# Patient Record
Sex: Female | Born: 1965 | Race: White | Hispanic: No | Marital: Married | Smoking: Never smoker
Health system: Southern US, Community
[De-identification: ages and names within clinical notes are randomized; demographics above are authoritative.]

## PROBLEM LIST (undated history)

## (undated) DIAGNOSIS — E282 Polycystic ovarian syndrome: Secondary | ICD-10-CM

## (undated) DIAGNOSIS — E119 Type 2 diabetes mellitus without complications: Secondary | ICD-10-CM

## (undated) DIAGNOSIS — I10 Essential (primary) hypertension: Secondary | ICD-10-CM

## (undated) DIAGNOSIS — K219 Gastro-esophageal reflux disease without esophagitis: Secondary | ICD-10-CM

## (undated) HISTORY — DX: Gastro-esophageal reflux disease without esophagitis: K21.9

## (undated) HISTORY — DX: Polycystic ovarian syndrome: E28.2

## (undated) HISTORY — DX: Type 2 diabetes mellitus without complications: E11.9

## (undated) HISTORY — DX: Essential (primary) hypertension: I10

---

## 1994-11-10 HISTORY — PX: LEEP: SHX91

## 2005-01-14 ENCOUNTER — Ambulatory Visit: Payer: Self-pay | Admitting: Unknown Physician Specialty

## 2008-07-11 ENCOUNTER — Ambulatory Visit: Payer: Self-pay

## 2009-07-03 ENCOUNTER — Ambulatory Visit: Payer: Self-pay

## 2009-10-16 ENCOUNTER — Ambulatory Visit: Payer: Self-pay

## 2010-08-06 ENCOUNTER — Ambulatory Visit: Payer: Self-pay

## 2011-11-19 ENCOUNTER — Ambulatory Visit: Payer: Self-pay

## 2013-09-14 ENCOUNTER — Ambulatory Visit: Payer: Self-pay

## 2014-09-20 ENCOUNTER — Ambulatory Visit: Payer: Self-pay

## 2015-06-13 DIAGNOSIS — N939 Abnormal uterine and vaginal bleeding, unspecified: Secondary | ICD-10-CM

## 2015-06-13 HISTORY — DX: Abnormal uterine and vaginal bleeding, unspecified: N93.9

## 2015-06-17 DIAGNOSIS — R7303 Prediabetes: Secondary | ICD-10-CM | POA: Insufficient documentation

## 2015-06-17 DIAGNOSIS — E119 Type 2 diabetes mellitus without complications: Secondary | ICD-10-CM | POA: Insufficient documentation

## 2015-06-17 DIAGNOSIS — E118 Type 2 diabetes mellitus with unspecified complications: Secondary | ICD-10-CM | POA: Insufficient documentation

## 2016-06-25 ENCOUNTER — Ambulatory Visit: Payer: Self-pay | Attending: Internal Medicine | Admitting: *Deleted

## 2016-06-25 ENCOUNTER — Encounter: Payer: Self-pay | Admitting: *Deleted

## 2016-06-25 ENCOUNTER — Encounter (INDEPENDENT_AMBULATORY_CARE_PROVIDER_SITE_OTHER): Payer: Self-pay

## 2016-06-25 VITALS — BP 144/91 | HR 88 | Temp 98.1°F | Ht 61.81 in | Wt 149.4 lb

## 2016-06-25 DIAGNOSIS — N63 Unspecified lump in unspecified breast: Secondary | ICD-10-CM

## 2016-06-25 NOTE — Patient Instructions (Signed)
Gave patient hand-out, Women Staying Healthy, Active and Well from [facility], with education on breast health, pap smears, heart and colon health. 

## 2016-06-25 NOTE — Progress Notes (Signed)
Subjective:     Patient ID: Sylvie FarrierRhonda Padia, female   DOB: 07/18/1966, 50 y.o.   MRN: [id number]  HPI   Review of Systems     Objective:   Physical Exam  Pulmonary/Chest: Right breast exhibits mass. Right breast exhibits no inverted nipple, no nipple discharge, no skin change and no tenderness. Left breast exhibits no inverted nipple, no mass, no nipple discharge, no skin change and no tenderness. Breasts are symmetrical.    Abdominal: There is no splenomegaly or hepatomegaly.       Assessment:     50 year [facility] White [facility]  Clinical breast exam reveals an approximate 1 cm tender nodule at 8:00 right breast approximately 1 cm from the areola.  Taught self breast awareness.  Patient just started birth control with an IUD in February.  States she has PCOS.  [facility]  Patient has been screened for eligibility.  She does not have any insurance, Medicare or Medicaid.  She also meets financial eligibility.  Hand-out given on the Affordable Care Act.    Plan:     Will get bilateral diagnostic mammogram and ultrasound.  Will follow-up per [facility] protocol.

## 2016-06-26 ENCOUNTER — Ambulatory Visit
Admission: RE | Admit: 2016-06-26 | Discharge: 2016-06-26 | Disposition: A | Discharge status: Home / Self Care | Payer: Self-pay | Source: Ambulatory Visit | Attending: Oncology | Admitting: Oncology

## 2016-06-26 ENCOUNTER — Encounter: Payer: Self-pay | Admitting: *Deleted

## 2016-06-26 ENCOUNTER — Telehealth: Payer: Self-pay | Admitting: *Deleted

## 2016-06-26 DIAGNOSIS — N63 Unspecified lump in unspecified breast: Secondary | ICD-10-CM

## 2016-06-26 NOTE — Telephone Encounter (Signed)
Called patient to review her benign mammogram results.  Although the ultrasound did reveal some tiny cysts I feel it would be prudent to send her on for surgical consult since there was a palpable finding.  She is agreeable.  Appointment scheduled to see Dr. Lemar LivingsByrnett on 07/17/16 @ 9:30.  She is to take a photo ID and all her meds.  Will follow-up per protocol and Dr. Lemar LivingsByrnett.

## 2016-07-10 ENCOUNTER — Encounter: Payer: Self-pay | Admitting: *Deleted

## 2016-07-17 ENCOUNTER — Other Ambulatory Visit: Payer: Self-pay

## 2016-07-17 ENCOUNTER — Encounter: Payer: Self-pay | Admitting: General Surgery

## 2016-07-17 ENCOUNTER — Ambulatory Visit (INDEPENDENT_AMBULATORY_CARE_PROVIDER_SITE_OTHER): Payer: PRIVATE HEALTH INSURANCE | Admitting: General Surgery

## 2016-07-17 DIAGNOSIS — N63 Unspecified lump in breast: Secondary | ICD-10-CM

## 2016-07-17 DIAGNOSIS — N6310 Unspecified lump in the right breast, unspecified quadrant: Secondary | ICD-10-CM

## 2016-07-17 NOTE — Progress Notes (Signed)
Patient ID: Meagan Gordon, female   DOB: 06-01-1966, 50 y.o.   MRN: [id number]  Chief Complaint  Patient presents with  . Other    right breast mass    HPI Meagan Gordon is a 50 y.o. female who presents for a breast evaluation. The most recent mammogram and Bilateral breast ultrasound was done on 06/26/16 . She states she did not feel anything different in the breast prior to her presentation for exam with the nurse navigator. She was able to appreciate the thickening described by the nurse but this has waxed and waned. Denies any breast injury or trauma. She does admit to right lateral breast "soreness" that comes and goes for about one month. Patient does perform regular self breast checks and gets regular mammograms done.  She did miss 2016 mammograms.  HPI  Past Medical History:  Diagnosis Date  . Hypertension   . PCOS (polycystic ovarian syndrome)     Past Surgical History:  Procedure Laterality Date  . CESAREAN SECTION  2000  . LEEP  1996    Family History  Problem Relation Age of Onset  . Stroke Mother   . Uterine cancer Mother 49  . Dementia Father   . Breast cancer Neg Hx     Social History Social History  Substance Use Topics  . Smoking status: Never Smoker  . Smokeless tobacco: Never Used  . Alcohol use Yes    No Known Allergies  Current Outpatient Prescriptions  Medication Sig Dispense Refill  . levonorgestrel (MIRENA) 20 MCG/24HR IUD by Intrauterine route.    . metFORMIN (GLUCOPHAGE) 500 MG tablet Take 500 mg by mouth.    . ranitidine (ZANTAC) 15 MG/ML syrup Take by mouth.    . spironolactone (ALDACTONE) 50 MG tablet Take 50 mg by mouth.     No current facility-administered medications for this visit.     Review of Systems Review of Systems  Constitutional: Negative.   Respiratory: Negative.   Cardiovascular: Negative.     Blood pressure (!) 144/82, pulse 90, resp. rate 14, height 5\' 2"  (1.575 m), weight 147 lb (66.7 kg).  Physical  Exam Physical Exam  Constitutional: She is oriented to person, place, and time. She appears well-developed and well-nourished.  HENT:  Mouth/Throat: Oropharynx is clear and moist.  Eyes: Conjunctivae are normal. No scleral icterus.  Neck: Neck supple.  Cardiovascular: Normal rate, regular rhythm and normal heart sounds.   Pulmonary/Chest: Effort normal and breath sounds normal. Right breast exhibits mass. Right breast exhibits no inverted nipple, no nipple discharge, no skin change and no tenderness. Left breast exhibits no inverted nipple, no mass, no nipple discharge, no skin change and no tenderness.    5 CFN at 8 o'clock right breast nodule.  Abdominal: Soft. There is no tenderness.  Lymphadenopathy:    She has no cervical adenopathy.  Neurological: She is alert and oriented to person, place, and time.  Skin: Skin is warm and dry.  Psychiatric: Her behavior is normal.    Data Reviewed Bilateral mammogram and bilateral breast ultrasound of 06/26/2016 reviewed. Right breast ultrasound described a 3 mm cyst 1 cm from the nipple which did not correlate with the physical exam reported by the nurse navigator or myself.  Focused ultrasound examination of the lower outer quadrant of the right breast was completed. This shows the breast parenchyma in the area of palpable thickening is a small ridge measuring 1.4 x 1.7 cm in diameter. This approaches the overlying skin at 0.65 cm were  the adjacent tissue is significantly further weighted 0.85 cm. This accounts for the palpable texture.  BI-RADS-2.   Assessment    Benign breast exam.    Plan    The patient should continue routine self examination and annual screening through the cancer Center.     This information has been scribed by Dorathy DaftMarsha Hatch RN, BSN,BC.  Earline MayotteByrnett, Manuelito Poage W 07/19/2016, 2:01 PM

## 2016-07-17 NOTE — Patient Instructions (Signed)
The patient is aware to call back for any questions or concerns.  

## 2016-07-19 DIAGNOSIS — N6310 Unspecified lump in the right breast, unspecified quadrant: Secondary | ICD-10-CM | POA: Insufficient documentation

## 2016-07-22 ENCOUNTER — Encounter: Payer: Self-pay | Admitting: *Deleted

## 2016-07-22 NOTE — Progress Notes (Signed)
Patient seen by Dr. Lemar LivingsByrnett.  Benign findings.  To follow-up in one year per his recommendation.  [facility] [place].

## 2017-07-06 ENCOUNTER — Encounter: Payer: Self-pay | Admitting: *Deleted

## 2017-07-06 ENCOUNTER — Encounter (INDEPENDENT_AMBULATORY_CARE_PROVIDER_SITE_OTHER): Payer: Self-pay

## 2017-07-06 ENCOUNTER — Ambulatory Visit
Admission: RE | Admit: 2017-07-06 | Discharge: 2017-07-06 | Disposition: A | Discharge status: Home / Self Care | Payer: Self-pay | Source: Ambulatory Visit | Attending: Oncology | Admitting: Oncology

## 2017-07-06 ENCOUNTER — Ambulatory Visit: Payer: Self-pay | Attending: Oncology | Admitting: *Deleted

## 2017-07-06 VITALS — BP 121/79 | HR 86 | Temp 98.5°F | Ht 62.0 in | Wt 142.0 lb

## 2017-07-06 DIAGNOSIS — Z0000 Encounter for general adult medical examination without abnormal findings: Secondary | ICD-10-CM

## 2017-07-06 DIAGNOSIS — N63 Unspecified lump in unspecified breast: Secondary | ICD-10-CM

## 2017-07-06 NOTE — Progress Notes (Signed)
Subjective:     Patient ID: Meagan Gordon, female   DOB: [birth year], 51 y.o.   MRN: [id number]  HPI   Review of Systems     Objective:   Physical Exam  Pulmonary/Chest:         Assessment:     51 year old White female returns to [facility] for annual screening.  On clinical breast exam I can again palpate an approximate 1 cm nodule at 7:00 right breast.  This is the same area palpated last year and felt to be of benign findings per Dr. Lemar Livings.  Taught self breast awareness.  Patient has been screened for eligibility.  She does not have any insurance, Medicare or Medicaid.  She also meets financial eligibility.  Hand-out given on the Affordable Care Act.    Plan:     Screening mammogram ordered.  Will follow-up per [facility] protocol.

## 2017-07-06 NOTE — Patient Instructions (Signed)
Gave patient hand-out, Women Staying Healthy, Active and Well from [facility], with education on breast health, pap smears, heart and colon health. 

## 2017-07-21 ENCOUNTER — Ambulatory Visit
Admission: RE | Admit: 2017-07-21 | Discharge: 2017-07-21 | Disposition: A | Discharge status: Home / Self Care | Payer: Self-pay | Source: Ambulatory Visit | Attending: Oncology | Admitting: Oncology

## 2017-07-21 DIAGNOSIS — N63 Unspecified lump in unspecified breast: Secondary | ICD-10-CM

## 2017-07-27 ENCOUNTER — Other Ambulatory Visit: Payer: Self-pay | Admitting: *Deleted

## 2017-07-27 DIAGNOSIS — N63 Unspecified lump in unspecified breast: Secondary | ICD-10-CM

## 2017-08-12 ENCOUNTER — Encounter: Payer: Self-pay | Admitting: Family Medicine

## 2017-08-25 ENCOUNTER — Encounter: Payer: Self-pay | Admitting: *Deleted

## 2017-08-25 NOTE — Progress Notes (Signed)
Letter mailed to inform patient of her birads 3 mammogram and appointment on January 19, 2018 @ 9:00 for her six month follow-up.

## 2018-01-19 ENCOUNTER — Ambulatory Visit
Admission: RE | Admit: 2018-01-19 | Discharge: 2018-01-19 | Disposition: A | Discharge status: Home / Self Care | Payer: Self-pay | Source: Ambulatory Visit | Attending: Oncology | Admitting: Oncology

## 2018-01-19 DIAGNOSIS — N6322 Unspecified lump in the left breast, upper inner quadrant: Secondary | ICD-10-CM | POA: Insufficient documentation

## 2018-01-19 DIAGNOSIS — N63 Unspecified lump in unspecified breast: Secondary | ICD-10-CM

## 2018-01-19 DIAGNOSIS — N6324 Unspecified lump in the left breast, lower inner quadrant: Secondary | ICD-10-CM | POA: Insufficient documentation

## 2018-01-20 ENCOUNTER — Ambulatory Visit: Payer: Self-pay | Attending: Oncology

## 2018-01-21 ENCOUNTER — Encounter: Payer: Self-pay | Admitting: *Deleted

## 2018-01-21 ENCOUNTER — Other Ambulatory Visit: Payer: Self-pay | Admitting: *Deleted

## 2018-01-21 DIAGNOSIS — N63 Unspecified lump in unspecified breast: Secondary | ICD-10-CM

## 2018-01-21 NOTE — Progress Notes (Signed)
Letter mailed to inform patient of her 6 month f/u and annual mammogram on 07/07/18 at 8:30 for [facility] and 10:00 for mammo.  [facility] to [place].

## 2018-01-21 NOTE — Progress Notes (Signed)
Patient with birads 3 mammogram.  Next mammo due in August.  Appointment letter mailed to inform patient of her [facility] appointment on 07/07/18 @ 8:30 and her mammogram at 10:00.

## 2018-05-27 ENCOUNTER — Encounter: Payer: Self-pay | Admitting: Internal Medicine

## 2018-05-27 ENCOUNTER — Other Ambulatory Visit: Payer: Self-pay | Admitting: Internal Medicine

## 2018-05-28 ENCOUNTER — Ambulatory Visit: Payer: BLUE CROSS/BLUE SHIELD | Admitting: Internal Medicine

## 2018-05-28 ENCOUNTER — Encounter: Payer: Self-pay | Admitting: Internal Medicine

## 2018-05-28 VITALS — BP 138/85 | HR 83 | Ht 62.0 in | Wt 146.0 lb

## 2018-05-28 DIAGNOSIS — R7302 Impaired glucose tolerance (oral): Secondary | ICD-10-CM | POA: Diagnosis not present

## 2018-05-28 DIAGNOSIS — K219 Gastro-esophageal reflux disease without esophagitis: Secondary | ICD-10-CM | POA: Diagnosis not present

## 2018-05-28 DIAGNOSIS — E282 Polycystic ovarian syndrome: Secondary | ICD-10-CM | POA: Diagnosis not present

## 2018-05-28 DIAGNOSIS — R1011 Right upper quadrant pain: Secondary | ICD-10-CM | POA: Insufficient documentation

## 2018-05-28 DIAGNOSIS — Z23 Encounter for immunization: Secondary | ICD-10-CM | POA: Diagnosis not present

## 2018-05-28 MED ORDER — OMEPRAZOLE 20 MG PO CPDR: 20.0000 mg | DELAYED_RELEASE_CAPSULE | Freq: Every day | ORAL | 5 refills | Status: DC

## 2018-05-28 NOTE — Patient Instructions (Signed)
Tdap Vaccine (Tetanus, Diphtheria and Pertussis): What You Need to Know 1. Why get vaccinated? Tetanus, diphtheria and pertussis are very serious diseases. Tdap vaccine can protect us from these diseases. And, Tdap vaccine given to pregnant women can protect newborn babies against pertussis. TETANUS (Lockjaw) is rare in the [place] today. It causes painful muscle tightening and stiffness, usually all over the body.  It can lead to tightening of muscles in the head and neck so you can't open your mouth, swallow, or sometimes even breathe. Tetanus kills about 1 out of 10 people who are infected even after receiving the best medical care.  DIPHTHERIA is also rare in the [place] today. It can cause a thick coating to form in the back of the throat.  It can lead to breathing problems, heart failure, paralysis, and death.  PERTUSSIS (Whooping Cough) causes severe coughing spells, which can cause difficulty breathing, vomiting and disturbed sleep.  It can also lead to weight loss, incontinence, and rib fractures. Up to 2 in 100 adolescents and 5 in 100 adults with pertussis are hospitalized or have complications, which could include pneumonia or death.  These diseases are caused by bacteria. Diphtheria and pertussis are spread from person to person through secretions from coughing or sneezing. Tetanus enters the body through cuts, scratches, or wounds. Before vaccines, as many as 200,000 cases of diphtheria, 200,000 cases of pertussis, and hundreds of cases of tetanus, were reported in the [place] each year. Since vaccination began, reports of cases for tetanus and diphtheria have dropped by about 99% and for pertussis by about 80%. 2. Tdap vaccine Tdap vaccine can protect adolescents and adults from tetanus, diphtheria, and pertussis. One dose of Tdap is routinely given at age 11 or 12. People who did not get Tdap at that age should get it as soon as possible. Tdap is especially  important for healthcare professionals and anyone having close contact with a baby younger than 12 months. Pregnant women should get a dose of Tdap during every pregnancy, to protect the newborn from pertussis. Infants are most at risk for severe, life-threatening complications from pertussis. Another vaccine, called Td, protects against tetanus and diphtheria, but not pertussis. A Td booster should be given every 10 years. Tdap may be given as one of these boosters if you have never gotten Tdap before. Tdap may also be given after a severe cut or burn to prevent tetanus infection. Your doctor or the person giving you the vaccine can give you more information. Tdap may safely be given at the same time as other vaccines. 3. Some people should not get this vaccine  A person who has ever had a life-threatening allergic reaction after a previous dose of any diphtheria, tetanus or pertussis containing vaccine, OR has a severe allergy to any part of this vaccine, should not get Tdap vaccine. Tell the person giving the vaccine about any severe allergies.  Anyone who had coma or long repeated seizures within 7 days after a childhood dose of DTP or DTaP, or a previous dose of Tdap, should not get Tdap, unless a cause other than the vaccine was found. They can still get Td.  Talk to your doctor if you: ? have seizures or another nervous system problem, ? had severe pain or swelling after any vaccine containing diphtheria, tetanus or pertussis, ? ever had a condition called Guillain-Barr Syndrome (GBS), ? aren't feeling well on the day the shot is scheduled. 4. Risks With any medicine, including   vaccines, there is a chance of side effects. These are usually mild and go away on their own. Serious reactions are also possible but are rare. Most people who get Tdap vaccine do not have any problems with it. Mild problems following Tdap: (Did not interfere with activities)  Pain where the shot was given (about  3 in 4 adolescents or 2 in 3 adults)  Redness or swelling where the shot was given (about 1 person in 5)  Mild fever of at least 100.4F (up to about 1 in 25 adolescents or 1 in 100 adults)  Headache (about 3 or 4 people in 10)  Tiredness (about 1 person in 3 or 4)  Nausea, vomiting, diarrhea, stomach ache (up to 1 in 4 adolescents or 1 in 10 adults)  Chills, sore joints (about 1 person in 10)  Body aches (about 1 person in 3 or 4)  Rash, swollen glands (uncommon)  Moderate problems following Tdap: (Interfered with activities, but did not require medical attention)  Pain where the shot was given (up to 1 in 5 or 6)  Redness or swelling where the shot was given (up to about 1 in 16 adolescents or 1 in 12 adults)  Fever over 102F (about 1 in 100 adolescents or 1 in 250 adults)  Headache (about 1 in 7 adolescents or 1 in 10 adults)  Nausea, vomiting, diarrhea, stomach ache (up to 1 or 3 people in 100)  Swelling of the entire arm where the shot was given (up to about 1 in 500).  Severe problems following Tdap: (Unable to perform usual activities; required medical attention)  Swelling, severe pain, bleeding and redness in the arm where the shot was given (rare).  Problems that could happen after any vaccine:  People sometimes faint after a medical procedure, including vaccination. Sitting or lying down for about 15 minutes can help prevent fainting, and injuries caused by a fall. Tell your doctor if you feel dizzy, or have vision changes or ringing in the ears.  Some people get severe pain in the shoulder and have difficulty moving the arm where a shot was given. This happens very rarely.  Any medication can cause a severe allergic reaction. Such reactions from a vaccine are very rare, estimated at fewer than 1 in a million doses, and would happen within a few minutes to a few hours after the vaccination. As with any medicine, there is a very remote chance of a vaccine  causing a serious injury or death. The safety of vaccines is always being monitored. For more information, visit: www.cdc.gov/vaccinesafety/ 5. What if there is a serious problem? What should I look for? Look for anything that concerns you, such as signs of a severe allergic reaction, very high fever, or unusual behavior. Signs of a severe allergic reaction can include hives, swelling of the face and throat, difficulty breathing, a fast heartbeat, dizziness, and weakness. These would usually start a few minutes to a few hours after the vaccination. What should I do?  If you think it is a severe allergic reaction or other emergency that can't wait, call 9-1-1 or get the person to the nearest hospital. Otherwise, call your doctor.  Afterward, the reaction should be reported to the Vaccine Adverse Event Reporting System (VAERS). Your doctor might file this report, or you can do it yourself through the VAERS web site at www.vaers.hhs.gov, or by calling 1-800-822-7967. ? VAERS does not give medical advice. 6. The National Vaccine Injury Compensation Program The National   Vaccine Injury Compensation Program (VICP) is a federal program that was created to compensate people who may have been injured by certain vaccines. Persons who believe they may have been injured by a vaccine can learn about the program and about filing a claim by calling 1-800-338-2382 or visiting the VICP website at www.hrsa.gov/vaccinecompensation. There is a time limit to file a claim for compensation. 7. How can I learn more?  Ask your doctor. He or she can give you the vaccine package insert or suggest other sources of information.  Call your local or state health department.  Contact the Centers for Disease Control and Prevention (CDC): ? Call 1-800-232-4636 (1-800-CDC-INFO) or ? Visit CDC's website at www.cdc.gov/vaccines CDC Tdap Vaccine VIS (01/03/14) This information is not intended to replace advice given to you by your  health care provider. Make sure you discuss any questions you have with your health care provider. Document Released: 04/27/2012 Document Revised: 07/17/2016 Document Reviewed: 07/17/2016 Elsevier Interactive Patient Education  2017 Elsevier Inc.  

## 2018-05-28 NOTE — Progress Notes (Signed)
Date:  05/28/2018   Name:  Meagan FarrierRhonda Gordon   DOB:  03/09/1966   MRN:  [id number]   Chief Complaint: Establish Care; Diabetes (Has borderline DM. ); and Gastroesophageal Reflux (Refill on omeprazole. )  Diabetes  She presents for her follow-up diabetic visit. Diabetes type: prediabetes. Her disease course has been stable. Pertinent negatives for hypoglycemia include no headaches or tremors. Pertinent negatives for diabetes include no chest pain, no fatigue, no polydipsia and no polyuria. Current diabetic treatment includes oral agent (monotherapy). An ACE inhibitor/angiotensin II receptor blocker is not being taken.  Gastroesophageal Reflux  She complains of abdominal pain and heartburn. She reports no chest pain, no coughing or no nausea. This is a new problem. The current episode started more than 1 month ago. The problem occurs constantly. The problem has been unchanged. Pertinent negatives include no fatigue. She has tried a PPI for the symptoms. The treatment provided moderate relief.  PCOS - diagnosed a few years ago.  On spironolactone and metformin. She keeps her small grandchildren regularly.  Does not know when the last tetanus shot was given.   Review of Systems  Constitutional: Negative for appetite change, fatigue, fever and unexpected weight change.  HENT: Negative for tinnitus and trouble swallowing.   Eyes: Negative for visual disturbance.  Respiratory: Negative for cough, chest tightness and shortness of breath.   Cardiovascular: Negative for chest pain, palpitations and leg swelling.  Gastrointestinal: Positive for abdominal pain and heartburn. Negative for blood in stool, constipation and nausea.  Endocrine: Negative for polydipsia and polyuria.  Genitourinary: Negative for dysuria and hematuria.  Musculoskeletal: Negative for arthralgias.  Neurological: Negative for tremors, numbness and headaches.  Psychiatric/Behavioral: Negative for dysphoric mood.    Patient Active  Problem List   Diagnosis Date Noted  . PCOS (polycystic ovarian syndrome) 05/28/2018  . Gastroesophageal reflux disease without esophagitis 05/28/2018  . Abdominal pain, RUQ 05/28/2018  . Breast mass, right 07/19/2016  . Impaired glucose tolerance 06/17/2015  . Abnormal uterine bleeding 06/13/2015    Prior to Admission medications   Medication Sig Start Date End Date Taking? Authorizing Provider  levonorgestrel (MIRENA) 20 MCG/24HR IUD by Intrauterine route. 12/31/15  Yes [provider]  metFORMIN (GLUCOPHAGE) 500 MG tablet Take 500 mg by mouth daily with breakfast.    Yes [provider]  omeprazole (PRILOSEC) 20 MG capsule Take by mouth. 01/29/18 01/29/19 Yes [provider]  spironolactone (ALDACTONE) 50 MG tablet Take 50 mg by mouth daily.  11/22/15 11/21/16  [provider]    No Known Allergies  Past Surgical History:  Procedure Laterality Date  . CESAREAN SECTION  2000  . LEEP  1996    Social History   Tobacco Use  . Smoking status: Never Smoker  . Smokeless tobacco: Never Used  Substance Use Topics  . Alcohol use: Yes    Comment: ocassional  . Drug use: No     Medication list has been reviewed and updated.  Current Meds  Medication Sig  . levonorgestrel (MIRENA) 20 MCG/24HR IUD by Intrauterine route.  . metFORMIN (GLUCOPHAGE) 500 MG tablet Take 500 mg by mouth daily with breakfast.   . omeprazole (PRILOSEC) 20 MG capsule Take 1 capsule (20 mg total) by mouth daily.  . [DISCONTINUED] omeprazole (PRILOSEC) 20 MG capsule Take by mouth.    PHQ 2/9 Scores 05/28/2018  PHQ - 2 Score 0    Physical Exam  Constitutional: She is oriented to person, place, and time. She appears well-developed.  No distress.  HENT:  Head: Normocephalic and atraumatic.  Eyes: Pupils are equal, round, and reactive to light.  Neck: Normal range of motion. Neck supple.  Cardiovascular: Normal rate and regular rhythm.  Pulmonary/Chest: Effort normal and  breath sounds normal. No respiratory distress. She has no wheezes.  Abdominal: Soft. Bowel sounds are normal. She exhibits no mass. There is no hepatosplenomegaly. There is tenderness in the right upper quadrant. There is no guarding.  Musculoskeletal: Normal range of motion.  Lymphadenopathy:    She has no cervical adenopathy.  Neurological: She is alert and oriented to person, place, and time.  Skin: Skin is warm and dry. No rash noted.  Psychiatric: She has a normal mood and affect. Her behavior is normal. Thought content normal.  Nursing note and vitals reviewed.   BP 138/85   Pulse 83   Ht 5\' 2"  (1.575 m)   Wt 146 lb (66.2 kg)   SpO2 98%   BMI 26.70 kg/m   Assessment and Plan: 1. Impaired glucose tolerance Continue metformin - try to take daily - Hemoglobin A1c  2. Gastroesophageal reflux disease without esophagitis Improved so will continue PPI - omeprazole (PRILOSEC) 20 MG capsule; Take 1 capsule (20 mg total) by mouth daily.  Dispense: 30 capsule; Refill: 5  3. Abdominal pain, RUQ Check H pylori and [place] - US Abdomen Limited RUQ; Future - H. pylori antibody, IgG  4. PCOS (polycystic ovarian syndrome) Continue spironolactone - Comprehensive metabolic panel  5. Need for diphtheria-tetanus-pertussis (Tdap) vaccine - Tdap vaccine greater than or equal to 7yo IM   Meds ordered this encounter  Medications  . omeprazole (PRILOSEC) 20 MG capsule    Sig: Take 1 capsule (20 mg total) by mouth daily.    Dispense:  30 capsule    Refill:  5    Partially dictated using Animal nutritionist. Any errors are unintentional.  Bari Edward, MD [facility]  05/28/2018   There are no diagnoses linked to this encounter.

## 2018-05-31 ENCOUNTER — Other Ambulatory Visit: Payer: Self-pay | Admitting: Internal Medicine

## 2018-05-31 ENCOUNTER — Encounter: Payer: Self-pay | Admitting: Internal Medicine

## 2018-05-31 DIAGNOSIS — A048 Other specified bacterial intestinal infections: Secondary | ICD-10-CM

## 2018-05-31 LAB — COMPREHENSIVE METABOLIC PANEL
A/G RATIO: 1.6 (ref 1.2–2.2)
ALBUMIN: 4.7 g/dL (ref 3.5–5.5)
ALK PHOS: 103 IU/L (ref 39–117)
ALT: 17 IU/L (ref 0–32)
AST: 17 IU/L (ref 0–40)
BILIRUBIN TOTAL: 0.3 mg/dL (ref 0.0–1.2)
BUN / CREAT RATIO: 14 (ref 9–23)
BUN: 12 mg/dL (ref 6–24)
CO2: 25 mmol/L (ref 20–29)
CREATININE: 0.88 mg/dL (ref 0.57–1.00)
Calcium: 9.9 mg/dL (ref 8.7–10.2)
Chloride: 98 mmol/L (ref 96–106)
GFR calc Af Amer: 89 mL/min/{1.73_m2} (ref >59)
GFR calc non Af Amer: 77 mL/min/{1.73_m2} (ref >59)
GLOBULIN, TOTAL: 2.9 g/dL (ref 1.5–4.5)
Glucose: 98 mg/dL (ref 65–99)
POTASSIUM: 4.1 mmol/L (ref 3.5–5.2)
SODIUM: 140 mmol/L (ref 134–144)
Total Protein: 7.6 g/dL (ref 6.0–8.5)

## 2018-05-31 LAB — H. PYLORI ANTIBODY, IGG: H. pylori, IgG AbS: 2.82 Index Value — ABNORMAL HIGH (ref 0.00–0.79)

## 2018-05-31 LAB — HEMOGLOBIN A1C
Est. average glucose Bld gHb Est-mCnc: 128 mg/dL
HEMOGLOBIN A1C: 6.1 % — AB (ref 4.8–5.6)

## 2018-05-31 MED ORDER — CLARITHROMYCIN 500 MG PO TABS: 500.0000 mg | ORAL_TABLET | Freq: Two times a day (BID) | ORAL | 0 refills | Status: AC

## 2018-05-31 MED ORDER — AMOXICILLIN 500 MG PO CAPS: 1000.0000 mg | ORAL_CAPSULE | Freq: Two times a day (BID) | ORAL | 0 refills | Status: AC

## 2018-06-01 ENCOUNTER — Ambulatory Visit: Payer: BLUE CROSS/BLUE SHIELD

## 2018-06-09 ENCOUNTER — Encounter: Payer: Self-pay | Admitting: Internal Medicine

## 2018-06-09 NOTE — Telephone Encounter (Signed)
Please advise patient message. I know we do not recheck h pylori. How long can it take for patient to feel back to normal?

## 2018-07-07 ENCOUNTER — Ambulatory Visit
Admission: RE | Admit: 2018-07-07 | Discharge: 2018-07-07 | Disposition: A | Discharge status: Home / Self Care | Payer: BLUE CROSS/BLUE SHIELD | Source: Ambulatory Visit | Attending: Oncology | Admitting: Oncology

## 2018-07-07 ENCOUNTER — Ambulatory Visit: Payer: BLUE CROSS/BLUE SHIELD | Attending: Oncology

## 2018-07-07 DIAGNOSIS — N63 Unspecified lump in unspecified breast: Secondary | ICD-10-CM

## 2018-07-09 ENCOUNTER — Ambulatory Visit: Payer: BLUE CROSS/BLUE SHIELD | Admitting: Internal Medicine

## 2018-07-09 ENCOUNTER — Encounter: Payer: Self-pay | Admitting: Internal Medicine

## 2018-07-09 VITALS — BP 124/90 | HR 95 | Ht 62.0 in | Wt 147.0 lb

## 2018-07-09 DIAGNOSIS — R1011 Right upper quadrant pain: Secondary | ICD-10-CM | POA: Diagnosis not present

## 2018-07-09 DIAGNOSIS — M779 Enthesopathy, unspecified: Secondary | ICD-10-CM | POA: Insufficient documentation

## 2018-07-09 DIAGNOSIS — M722 Plantar fascial fibromatosis: Secondary | ICD-10-CM | POA: Diagnosis not present

## 2018-07-09 MED ORDER — MELOXICAM 15 MG PO TABS: 15.0000 mg | ORAL_TABLET | Freq: Every day | ORAL | 3 refills | Status: DC

## 2018-07-09 NOTE — Progress Notes (Signed)
Date:  07/09/2018   Name:  Meagan FarrierRhonda Mccully   DOB:  07/05/1966   MRN:  [id number]   Chief Complaint: Abdominal Pain (Pain that feels like have "rock" sitting in URQ. On and off. ); Arm Pain (Right arm- 4 weeks ago. Painful- hurts when lifting, and motion. Tried asper cream, tylenol and arm brace which made it worse. ); and Foot Pain (Left heel of foot hurting. Had plantar faciatis before but this pain is different. )  Abdominal Pain  This is a chronic problem. The problem occurs daily. The problem has been rapidly improving. The pain is located in the RUQ. The pain is mild (much improved after treatment for H PYlor). Associated symptoms include arthralgias and myalgias. Pertinent negatives include no constipation, diarrhea, dysuria, fever, headaches, hematuria, nausea or vomiting.  Arm Pain   There was no injury mechanism. The pain is present in the right forearm. The quality of the pain is described as burning and aching. The pain does not radiate. The pain is mild. Pertinent negatives include no chest pain, muscle weakness, numbness or tingling. The symptoms are aggravated by movement and lifting.  Foot Pain  This is a recurrent problem. Associated symptoms include abdominal pain, arthralgias and myalgias. Pertinent negatives include no chest pain, coughing, fatigue, fever, headaches, nausea, numbness or vomiting.  Foot pain similar to previous plantar fasciits but in a different location on the side of the heel and on the sole of the foot at the heel.   Review of Systems  Constitutional: Negative for appetite change, fatigue, fever and unexpected weight change.  HENT: Negative for tinnitus and trouble swallowing.   Respiratory: Negative for cough, chest tightness and shortness of breath.   Cardiovascular: Negative for chest pain, palpitations and leg swelling.  Gastrointestinal: Positive for abdominal pain. Negative for constipation, diarrhea, nausea and vomiting.  Endocrine: Negative for  polydipsia and polyuria.  Genitourinary: Negative for dysuria and hematuria.  Musculoskeletal: Positive for arthralgias and myalgias.  Neurological: Negative for tingling, tremors, numbness and headaches.  Psychiatric/Behavioral: Negative for dysphoric mood.    Patient Active Problem List   Diagnosis Date Noted  . PCOS (polycystic ovarian syndrome) 05/28/2018  . Gastroesophageal reflux disease without esophagitis 05/28/2018  . Abdominal pain, RUQ 05/28/2018  . Breast mass, right 07/19/2016  . Impaired glucose tolerance 06/17/2015  . Abnormal uterine bleeding 06/13/2015    No Known Allergies  Past Surgical History:  Procedure Laterality Date  . CESAREAN SECTION  2000  . LEEP  1996    Social History   Tobacco Use  . Smoking status: Never Smoker  . Smokeless tobacco: Never Used  Substance Use Topics  . Alcohol use: Yes    Comment: ocassional  . Drug use: No     Medication list has been reviewed and updated.  Current Meds  Medication Sig  . levonorgestrel (MIRENA) 20 MCG/24HR IUD by Intrauterine route.  . metFORMIN (GLUCOPHAGE) 500 MG tablet Take 500 mg by mouth daily with breakfast.   . omeprazole (PRILOSEC) 20 MG capsule Take 1 capsule (20 mg total) by mouth daily.  Marland Kitchen. spironolactone (ALDACTONE) 50 MG tablet Take 50 mg by mouth daily.     PHQ 2/9 Scores 05/28/2018  PHQ - 2 Score 0    Physical Exam  Constitutional: She is oriented to person, place, and time. She appears well-developed. No distress.  HENT:  Head: Normocephalic and atraumatic.  Cardiovascular: Normal rate and regular rhythm.  Pulmonary/Chest: Effort normal. No respiratory distress.  Abdominal: Soft.  Normal appearance, normal aorta and bowel sounds are normal. There is tenderness (mild) in the right upper quadrant.  Musculoskeletal: Normal range of motion.       Arms:      Feet:  Neurological: She is alert and oriented to person, place, and time.  Skin: Skin is warm and dry. No rash noted.    Psychiatric: She has a normal mood and affect. Her behavior is normal. Thought content normal.  Nursing note and vitals reviewed.   BP 124/90 (BP Location: Left Arm, Patient Position: Sitting, Cuff Size: Normal)   Pulse 95   Ht 5\' 2"  (1.575 m)   Wt 147 lb (66.7 kg)   SpO2 99%   BMI 26.89 kg/m   Assessment and Plan: 1. Abdominal pain, RUQ Improved after H Pylori treatment but still intermittent RUQ discomfort - US Abdomen Limited RUQ; Future  2. Tendonitis Ice after use, limit aggravating movements - meloxicam (MOBIC) 15 MG tablet; Take 1 tablet (15 mg total) by mouth daily.  Dispense: 30 tablet; Refill: 3  3. Plantar fasciitis of left foot Ice massage, stretching (pt has had treatment in the past for same) - meloxicam (MOBIC) 15 MG tablet; Take 1 tablet (15 mg total) by mouth daily.  Dispense: 30 tablet; Refill: 3   Meds ordered this encounter  Medications  . meloxicam (MOBIC) 15 MG tablet    Sig: Take 1 tablet (15 mg total) by mouth daily.    Dispense:  30 tablet    Refill:  3    Partially dictated using Animal nutritionist. Any errors are unintentional.  Bari Edward, MD [facility]  07/09/2018   There are no diagnoses linked to this encounter.

## 2018-07-14 ENCOUNTER — Ambulatory Visit: Payer: BLUE CROSS/BLUE SHIELD

## 2018-07-16 ENCOUNTER — Emergency Department
Admission: EM | Admit: 2018-07-16 | Discharge: 2018-07-16 | Disposition: A | Discharge status: Home / Self Care | Payer: BLUE CROSS/BLUE SHIELD | Attending: Emergency Medicine | Admitting: Emergency Medicine

## 2018-07-16 ENCOUNTER — Emergency Department: Payer: BLUE CROSS/BLUE SHIELD

## 2018-07-16 ENCOUNTER — Other Ambulatory Visit: Payer: Self-pay

## 2018-07-16 DIAGNOSIS — M47812 Spondylosis without myelopathy or radiculopathy, cervical region: Secondary | ICD-10-CM | POA: Insufficient documentation

## 2018-07-16 DIAGNOSIS — M542 Cervicalgia: Secondary | ICD-10-CM | POA: Diagnosis not present

## 2018-07-16 DIAGNOSIS — M4302 Spondylolysis, cervical region: Secondary | ICD-10-CM | POA: Diagnosis not present

## 2018-07-16 DIAGNOSIS — Z7984 Long term (current) use of oral hypoglycemic drugs: Secondary | ICD-10-CM | POA: Insufficient documentation

## 2018-07-16 DIAGNOSIS — Z79899 Other long term (current) drug therapy: Secondary | ICD-10-CM | POA: Diagnosis not present

## 2018-07-16 DIAGNOSIS — S299XXA Unspecified injury of thorax, initial encounter: Secondary | ICD-10-CM | POA: Diagnosis not present

## 2018-07-16 DIAGNOSIS — E119 Type 2 diabetes mellitus without complications: Secondary | ICD-10-CM | POA: Diagnosis not present

## 2018-07-16 DIAGNOSIS — S199XXA Unspecified injury of neck, initial encounter: Secondary | ICD-10-CM | POA: Diagnosis not present

## 2018-07-16 DIAGNOSIS — I10 Essential (primary) hypertension: Secondary | ICD-10-CM | POA: Insufficient documentation

## 2018-07-16 MED ORDER — LIDOCAINE 5 % EX PTCH: 1.0000 | MEDICATED_PATCH | Freq: Two times a day (BID) | CUTANEOUS | 0 refills | Status: DC

## 2018-07-16 MED ORDER — CYCLOBENZAPRINE HCL 5 MG PO TABS: ORAL_TABLET | ORAL | 0 refills | Status: DC

## 2018-07-16 MED ORDER — NAPROXEN 500 MG PO TABS: 500.0000 mg | ORAL_TABLET | Freq: Two times a day (BID) | ORAL | 0 refills | Status: DC
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
## 2018-07-19 ENCOUNTER — Ambulatory Visit
Admission: RE | Admit: 2018-07-19 | Discharge: 2018-07-19 | Disposition: A | Discharge status: Home / Self Care | Payer: BLUE CROSS/BLUE SHIELD | Source: Ambulatory Visit | Attending: Internal Medicine | Admitting: Internal Medicine

## 2018-07-19 ENCOUNTER — Encounter: Payer: Self-pay | Admitting: Internal Medicine

## 2018-07-19 DIAGNOSIS — R1011 Right upper quadrant pain: Secondary | ICD-10-CM | POA: Insufficient documentation

## 2018-09-27 ENCOUNTER — Encounter: Payer: Self-pay | Admitting: Internal Medicine

## 2018-09-28 ENCOUNTER — Ambulatory Visit: Payer: BLUE CROSS/BLUE SHIELD | Admitting: Internal Medicine

## 2018-09-28 ENCOUNTER — Encounter: Payer: Self-pay | Admitting: Internal Medicine

## 2018-09-28 VITALS — BP 140/78 | HR 96 | Ht 62.0 in | Wt 146.0 lb

## 2018-09-28 DIAGNOSIS — M62838 Other muscle spasm: Secondary | ICD-10-CM

## 2018-09-28 DIAGNOSIS — K219 Gastro-esophageal reflux disease without esophagitis: Secondary | ICD-10-CM

## 2018-09-28 DIAGNOSIS — Z23 Encounter for immunization: Secondary | ICD-10-CM

## 2018-09-28 DIAGNOSIS — E282 Polycystic ovarian syndrome: Secondary | ICD-10-CM

## 2018-09-28 DIAGNOSIS — R7302 Impaired glucose tolerance (oral): Secondary | ICD-10-CM | POA: Diagnosis not present

## 2018-09-28 MED ORDER — METHOCARBAMOL 500 MG PO TABS: 500.0000 mg | ORAL_TABLET | Freq: Three times a day (TID) | ORAL | 0 refills | Status: DC | PRN

## 2018-09-28 MED ORDER — OMEPRAZOLE 20 MG PO CPDR: 20.0000 mg | DELAYED_RELEASE_CAPSULE | Freq: Two times a day (BID) | ORAL | 5 refills | Status: DC

## 2018-09-28 MED ORDER — OMEPRAZOLE 20 MG PO CPDR: 20.0000 mg | DELAYED_RELEASE_CAPSULE | Freq: Every day | ORAL | 5 refills | Status: DC
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
## 2018-09-29 LAB — BASIC METABOLIC PANEL
BUN/Creatinine Ratio: 13 (ref 9–23)
BUN: 11 mg/dL (ref 6–24)
CALCIUM: 10.1 mg/dL (ref 8.7–10.2)
CO2: 25 mmol/L (ref 20–29)
CREATININE: 0.86 mg/dL (ref 0.57–1.00)
Chloride: 98 mmol/L (ref 96–106)
GFR calc Af Amer: 91 mL/min/{1.73_m2} (ref >59)
GFR, EST NON AFRICAN AMERICAN: 79 mL/min/{1.73_m2} (ref >59)
GLUCOSE: 119 mg/dL — AB (ref 65–99)
Potassium: 4.2 mmol/L (ref 3.5–5.2)
Sodium: 139 mmol/L (ref 134–144)

## 2018-09-29 LAB — HEMOGLOBIN A1C
ESTIMATED AVERAGE GLUCOSE: 128 mg/dL
HEMOGLOBIN A1C: 6.1 % — AB (ref 4.8–5.6)

## 2018-10-15 ENCOUNTER — Other Ambulatory Visit: Payer: Self-pay | Admitting: Internal Medicine

## 2018-10-15 DIAGNOSIS — M62838 Other muscle spasm: Secondary | ICD-10-CM

## 2018-10-22 ENCOUNTER — Other Ambulatory Visit: Payer: Self-pay | Admitting: Internal Medicine

## 2018-10-22 DIAGNOSIS — M779 Enthesopathy, unspecified: Secondary | ICD-10-CM

## 2018-10-22 DIAGNOSIS — M722 Plantar fascial fibromatosis: Secondary | ICD-10-CM

## 2018-11-08 ENCOUNTER — Encounter: Payer: Self-pay | Admitting: Internal Medicine

## 2018-11-08 ENCOUNTER — Ambulatory Visit: Payer: BLUE CROSS/BLUE SHIELD | Admitting: Internal Medicine

## 2018-11-08 VITALS — BP 128/82 | HR 86 | Temp 98.0°F | Ht 62.0 in | Wt 148.0 lb

## 2018-11-08 DIAGNOSIS — J0100 Acute maxillary sinusitis, unspecified: Secondary | ICD-10-CM | POA: Diagnosis not present

## 2018-11-08 DIAGNOSIS — E282 Polycystic ovarian syndrome: Secondary | ICD-10-CM | POA: Diagnosis not present

## 2018-11-08 DIAGNOSIS — M62838 Other muscle spasm: Secondary | ICD-10-CM | POA: Diagnosis not present

## 2018-11-08 MED ORDER — SPIRONOLACTONE 50 MG PO TABS: 50.0000 mg | ORAL_TABLET | Freq: Two times a day (BID) | ORAL | 12 refills | Status: DC

## 2018-11-08 MED ORDER — DOXYCYCLINE HYCLATE 100 MG PO TABS: 100.0000 mg | ORAL_TABLET | Freq: Two times a day (BID) | ORAL | 0 refills | Status: AC

## 2018-11-08 NOTE — Progress Notes (Signed)
Date:  11/08/2018   Name:  Meagan FarrierRhonda Sanabia   DOB:  01/13/1966   MRN:  [id number]   Chief Complaint: Cough (Off and on X 1 month. Husband has bronchitis. Somtimes mucous is coming up mainly in the mornings. Throat sore, ears both hurt inside. No fever but dizziness earlier today. ) and Hypertension (Needs refill on spironalactone. )  Cough  This is a new problem. The current episode started in the past 7 days. The cough is productive of sputum. Associated symptoms include chills, myalgias, nasal congestion, rhinorrhea and a sore throat. Pertinent negatives include no ear pain, fever, shortness of breath or wheezing. She has tried OTC cough suppressant for the symptoms. The treatment provided mild relief.  Neck Pain   This is a new problem. The current episode started more than 1 month ago. The problem has been gradually improving. Pertinent negatives include no fever or trouble swallowing. She has tried NSAIDs (mobic has been very helpful but she is not sure she can take it every day) for the symptoms.   PCOS - on spironolactone daily for PCOS and mild elevation in BP.  She is due for a refill.  She is not sure that she was ever diagnosed with HTN - she has seen cardiology who felt it was mostly white coat HTN and did not recommend additional medication in 2017.  Review of Systems  Constitutional: Positive for chills and fatigue. Negative for diaphoresis, fever and unexpected weight change.  HENT: Positive for congestion, rhinorrhea and sore throat. Negative for ear pain and trouble swallowing.   Respiratory: Positive for cough. Negative for chest tightness, shortness of breath and wheezing.   Musculoskeletal: Positive for myalgias and neck pain.    Patient Active Problem List   Diagnosis Date Noted  . Tendonitis 07/09/2018  . Plantar fasciitis of left foot 07/09/2018  . PCOS (polycystic ovarian syndrome) 05/28/2018  . Gastroesophageal reflux disease without esophagitis 05/28/2018  .  Abdominal pain, RUQ 05/28/2018  . Breast mass, right 07/19/2016  . Prediabetes 06/17/2015  . Abnormal uterine bleeding 06/13/2015    No Known Allergies  Past Surgical History:  Procedure Laterality Date  . CESAREAN SECTION  2000  . LEEP  1996    Social History   Tobacco Use  . Smoking status: Never Smoker  . Smokeless tobacco: Never Used  Substance Use Topics  . Alcohol use: Yes    Comment: ocassional  . Drug use: No     Medication list has been reviewed and updated.  Current Meds  Medication Sig  . levonorgestrel (MIRENA) 20 MCG/24HR IUD by Intrauterine route.  . lidocaine (LIDODERM) 5 % Place 1 patch onto the skin every 12 (twelve) hours. Remove & Discard patch within 12 hours or as directed by MD  . meloxicam (MOBIC) 15 MG tablet TAKE 1 TABLET BY MOUTH EVERY DAY  . metFORMIN (GLUCOPHAGE) 500 MG tablet Take 500 mg by mouth daily with breakfast.   . methocarbamol (ROBAXIN) 500 MG tablet Take 1 tablet (500 mg total) by mouth every 8 (eight) hours as needed for muscle spasms.  Marland Kitchen. omeprazole (PRILOSEC) 20 MG capsule Take 1 capsule (20 mg total) by mouth 2 (two) times daily before a meal.    PHQ 2/9 Scores 05/28/2018  PHQ - 2 Score 0    Physical Exam Constitutional:      Appearance: She is well-developed.  HENT:     Right Ear: Ear canal and external ear normal. Tympanic membrane is not erythematous or  retracted.     Left Ear: Ear canal and external ear normal. Tympanic membrane is not erythematous or retracted.     Nose:     Right Sinus: Maxillary sinus tenderness and frontal sinus tenderness present.     Left Sinus: Maxillary sinus tenderness and frontal sinus tenderness present.     Mouth/Throat:     Mouth: No oral lesions.     Pharynx: Uvula midline. Posterior oropharyngeal erythema present. No oropharyngeal exudate.  Neck:     Musculoskeletal: Normal range of motion and neck supple.  Cardiovascular:     Rate and Rhythm: Normal rate and regular rhythm.      Pulses: Normal pulses.     Heart sounds: Normal heart sounds.  Pulmonary:     Effort: Pulmonary effort is normal.     Breath sounds: Normal breath sounds and air entry. No decreased breath sounds, wheezing or rales.  Lymphadenopathy:     Cervical: No cervical adenopathy.  Neurological:     Mental Status: She is alert and oriented to person, place, and time.     BP 128/82 (BP Location: Right Arm, Patient Position: Sitting, Cuff Size: Normal)   Pulse 86   Temp 98 F (36.7 C) (Oral)   Ht 5\' 2"  (1.575 m)   Wt 148 lb (67.1 kg)   SpO2 100%   BMI 27.07 kg/m   Assessment and Plan: 1. Acute non-recurrent maxillary sinusitis Take Tessalon perles or Delsym for cough - doxycycline (VIBRA-TABS) 100 MG tablet; Take 1 tablet (100 mg total) by mouth 2 (two) times daily for 10 days.  Dispense: 20 tablet; Refill: 0  2. PCOS (polycystic ovarian syndrome) Continue spironolactone - spironolactone (ALDACTONE) 50 MG tablet; Take 1 tablet (50 mg total) by mouth 2 (two) times daily.  Dispense: 60 tablet; Refill: 12  3. Neck muscle spasm Much improved with Mobic - continue daily if needed   Partially dictated using Dragon software. Any errors are unintentional.  Bari EdwardLaura Berglund, MD [facility]  11/08/2018

## 2018-11-08 NOTE — Patient Instructions (Signed)
Use Tessalon perles for cough  Or Delsym for cough

## 2018-12-20 ENCOUNTER — Ambulatory Visit: Payer: BLUE CROSS/BLUE SHIELD | Admitting: Internal Medicine

## 2018-12-20 ENCOUNTER — Other Ambulatory Visit: Payer: Self-pay

## 2018-12-20 ENCOUNTER — Encounter: Payer: Self-pay | Admitting: Internal Medicine

## 2018-12-20 VITALS — BP 130/80 | HR 94 | Temp 98.1°F | Ht 62.0 in | Wt 150.0 lb

## 2018-12-20 DIAGNOSIS — Z1211 Encounter for screening for malignant neoplasm of colon: Secondary | ICD-10-CM | POA: Diagnosis not present

## 2018-12-20 DIAGNOSIS — R1011 Right upper quadrant pain: Secondary | ICD-10-CM | POA: Diagnosis not present

## 2018-12-20 DIAGNOSIS — K30 Functional dyspepsia: Secondary | ICD-10-CM | POA: Diagnosis not present

## 2018-12-20 DIAGNOSIS — J0100 Acute maxillary sinusitis, unspecified: Secondary | ICD-10-CM

## 2018-12-20 MED ORDER — AMOXICILLIN-POT CLAVULANATE 875-125 MG PO TABS: 1.0000 | ORAL_TABLET | Freq: Two times a day (BID) | ORAL | 0 refills | Status: AC

## 2018-12-20 NOTE — Patient Instructions (Signed)
Coricidin HPB - take for a few days to relieve sinus and ear pressure

## 2018-12-20 NOTE — Progress Notes (Signed)
Date:  12/20/2018   Name:  Meagan Gordon   DOB:  05/06/1966   MRN:  [id number]   Chief Complaint: Abdominal Pain (Right pain. Dull ache sometimes stronger. Still hurting. Moving around to back on right side. Burping a lot. ) and Sinusitis (Sinus drainage and sore throat. Ear pain- bilateral. Had for 2 months. Cough. Green mucous first thing in morning. )  Abdominal Pain  This is a recurrent problem. The problem occurs daily. The problem has been unchanged. The pain is located in the RUQ. The pain is mild. The quality of the pain is a sensation of fullness and dull. The abdominal pain radiates to the right flank. Associated symptoms include belching. Pertinent negatives include no diarrhea, fever, headaches, hematochezia, nausea, vomiting or weight loss. Nothing aggravates the pain. The pain is relieved by nothing. She has tried antacids for the symptoms. Prior diagnostic workup includes ultrasound (negative for gall stones).  Sinusitis  This is a new problem. The current episode started in the past 7 days. There has been no fever. Associated symptoms include congestion and sinus pressure. Pertinent negatives include no chills, headaches, shortness of breath or sore throat. Treatments tried: zyrtec. The treatment provided no relief.    Review of Systems  Constitutional: Negative for chills, fatigue, fever, unexpected weight change and weight loss.  HENT: Positive for congestion and sinus pressure. Negative for sore throat and trouble swallowing.   Respiratory: Negative for shortness of breath and wheezing.   Cardiovascular: Negative for chest pain, palpitations and leg swelling.  Gastrointestinal: Positive for abdominal pain. Negative for blood in stool, diarrhea, hematochezia, nausea and vomiting.  Neurological: Negative for dizziness, light-headedness and headaches.    Patient Active Problem List   Diagnosis Date Noted  . Neck muscle spasm 11/08/2018  . Tendonitis 07/09/2018  . Plantar  fasciitis of left foot 07/09/2018  . PCOS (polycystic ovarian syndrome) 05/28/2018  . Gastroesophageal reflux disease without esophagitis 05/28/2018  . Abdominal pain, RUQ 05/28/2018  . Breast mass, right 07/19/2016  . Prediabetes 06/17/2015  . Abnormal uterine bleeding 06/13/2015    No Known Allergies  Past Surgical History:  Procedure Laterality Date  . CESAREAN SECTION  2000  . LEEP  1996    Social History   Tobacco Use  . Smoking status: Never Smoker  . Smokeless tobacco: Never Used  Substance Use Topics  . Alcohol use: Yes    Comment: ocassional  . Drug use: No     Medication list has been reviewed and updated.  Current Meds  Medication Sig  . levonorgestrel (MIRENA) 20 MCG/24HR IUD by Intrauterine route.  . lidocaine (LIDODERM) 5 % Place 1 patch onto the skin every 12 (twelve) hours. Remove & Discard patch within 12 hours or as directed by MD  . methocarbamol (ROBAXIN) 500 MG tablet Take 1 tablet (500 mg total) by mouth every 8 (eight) hours as needed for muscle spasms.  Marland Kitchen omeprazole (PRILOSEC) 20 MG capsule Take 1 capsule (20 mg total) by mouth 2 (two) times daily before a meal.  . spironolactone (ALDACTONE) 50 MG tablet Take 1 tablet (50 mg total) by mouth 2 (two) times daily.    PHQ 2/9 Scores 12/20/2018 05/28/2018  PHQ - 2 Score 0 0    Physical Exam Vitals signs and nursing note reviewed.  Constitutional:      General: She is not in acute distress.    Appearance: She is well-developed.  HENT:     Head: Normocephalic and atraumatic.  Right Ear: Ear canal and external ear normal. Tympanic membrane is retracted. Tympanic membrane is not erythematous.     Left Ear: Ear canal and external ear normal. Tympanic membrane is retracted. Tympanic membrane is not erythematous.     Nose:     Right Sinus: Maxillary sinus tenderness present. No frontal sinus tenderness.     Left Sinus: Maxillary sinus tenderness present. No frontal sinus tenderness.      Mouth/Throat:     Mouth: No oral lesions.     Pharynx: Uvula midline. Posterior oropharyngeal erythema present. No oropharyngeal exudate.  Cardiovascular:     Rate and Rhythm: Normal rate and regular rhythm.     Heart sounds: Normal heart sounds.  Pulmonary:     Effort: Pulmonary effort is normal. No respiratory distress.     Breath sounds: Normal breath sounds. No wheezing or rales.  Abdominal:     General: Abdomen is flat. Bowel sounds are decreased.     Tenderness: There is abdominal tenderness in the right upper quadrant. There is no guarding or rebound.     Hernia: No hernia is present.  Musculoskeletal: Normal range of motion.  Lymphadenopathy:     Cervical: No cervical adenopathy.  Skin:    General: Skin is warm and dry.     Findings: No rash.  Neurological:     Mental Status: She is alert and oriented to person, place, and time.  Psychiatric:        Behavior: Behavior normal.        Thought Content: Thought content normal.     BP 130/80   Pulse 94   Temp 98.1 F (36.7 C) (Oral)   Ht 5\' 2"  (1.575 m)   Wt 150 lb (68 kg)   SpO2 99%   BMI 27.44 kg/m   Assessment and Plan: 1. Right upper quadrant pain S/p treatment for H pylori with little improvement Normal [place] - no gallstones - H. pylori breath test - CT ABDOMEN W CONTRAST; Future - Basic metabolic panel  2. Colon cancer screening Due now for screeing Will also complete evaluation for RUQ pain - Ambulatory referral to Gastroenterology  3. Acute non-recurrent maxillary sinusitis Continue zyrtec Add Coricidin - amoxicillin-clavulanate (AUGMENTIN) 875-125 MG tablet; Take 1 tablet by mouth 2 (two) times daily for 10 days.  Dispense: 20 tablet; Refill: 0   Partially dictated using Animal nutritionist. Any errors are unintentional.  Bari Edward, MD [facility]  12/20/2018

## 2018-12-21 ENCOUNTER — Ambulatory Visit: Payer: BLUE CROSS/BLUE SHIELD | Admitting: Internal Medicine

## 2018-12-21 LAB — BASIC METABOLIC PANEL
BUN/Creatinine Ratio: 13 (ref 9–23)
BUN: 10 mg/dL (ref 6–24)
CALCIUM: 9.6 mg/dL (ref 8.7–10.2)
CO2: 25 mmol/L (ref 20–29)
CREATININE: 0.78 mg/dL (ref 0.57–1.00)
Chloride: 99 mmol/L (ref 96–106)
GFR calc Af Amer: 103 mL/min/{1.73_m2} (ref >59)
GFR, EST NON AFRICAN AMERICAN: 89 mL/min/{1.73_m2} (ref >59)
Glucose: 93 mg/dL (ref 65–99)
POTASSIUM: 4.4 mmol/L (ref 3.5–5.2)
Sodium: 138 mmol/L (ref 134–144)

## 2018-12-22 ENCOUNTER — Other Ambulatory Visit: Payer: BLUE CROSS/BLUE SHIELD

## 2018-12-22 LAB — H. PYLORI BREATH TEST: H pylori Breath Test: NEGATIVE

## 2018-12-23 ENCOUNTER — Other Ambulatory Visit: Payer: Self-pay | Admitting: Internal Medicine

## 2018-12-23 DIAGNOSIS — J0100 Acute maxillary sinusitis, unspecified: Secondary | ICD-10-CM

## 2018-12-23 MED ORDER — AZITHROMYCIN 250 MG PO TABS: ORAL_TABLET | ORAL | 0 refills | Status: AC

## 2018-12-23 NOTE — Progress Notes (Signed)
Spoke with patient. Informed of NEG labs. Told me she had to reschedule CT to the 25th. Told her we will await these results and I will call her when they come back.  Also, she said she is struggling to take Amoxicillin tabs because they are too big. She has tried cutting them in half and still struggling. Wants to know if you can send it diff abx.  Please Advise.

## 2019-01-04 ENCOUNTER — Other Ambulatory Visit: Payer: Self-pay | Admitting: Internal Medicine

## 2019-01-04 ENCOUNTER — Ambulatory Visit
Admission: RE | Admit: 2019-01-04 | Discharge: 2019-01-04 | Disposition: A | Discharge status: Home / Self Care | Payer: BLUE CROSS/BLUE SHIELD | Source: Ambulatory Visit | Attending: Internal Medicine | Admitting: Internal Medicine

## 2019-01-04 DIAGNOSIS — K5730 Diverticulosis of large intestine without perforation or abscess without bleeding: Secondary | ICD-10-CM | POA: Diagnosis not present

## 2019-01-04 DIAGNOSIS — R1011 Right upper quadrant pain: Secondary | ICD-10-CM

## 2019-01-04 MED ORDER — DICYCLOMINE HCL 10 MG PO CAPS: 10.0000 mg | ORAL_CAPSULE | Freq: Three times a day (TID) | ORAL | 1 refills | Status: DC

## 2019-01-04 MED ORDER — IOHEXOL 300 MG/ML  SOLN
100.0000 mL | Freq: Once | INTRAMUSCULAR | Status: AC | PRN
  Administered 2019-01-04: 100 mL via INTRAVENOUS

## 2019-01-11 ENCOUNTER — Encounter: Payer: Self-pay | Admitting: *Deleted

## 2019-01-26 ENCOUNTER — Other Ambulatory Visit: Payer: Self-pay | Admitting: Internal Medicine

## 2019-01-26 DIAGNOSIS — R1011 Right upper quadrant pain: Secondary | ICD-10-CM

## 2019-01-28 ENCOUNTER — Ambulatory Visit: Payer: BLUE CROSS/BLUE SHIELD | Admitting: Internal Medicine

## 2019-01-28 ENCOUNTER — Encounter: Payer: Self-pay | Admitting: Internal Medicine

## 2019-01-28 ENCOUNTER — Other Ambulatory Visit: Payer: Self-pay

## 2019-01-28 VITALS — BP 132/80 | HR 89 | Resp 16 | Ht 62.0 in | Wt 148.0 lb

## 2019-01-28 DIAGNOSIS — F39 Unspecified mood [affective] disorder: Secondary | ICD-10-CM

## 2019-01-28 DIAGNOSIS — R1011 Right upper quadrant pain: Secondary | ICD-10-CM | POA: Diagnosis not present

## 2019-01-28 DIAGNOSIS — R7303 Prediabetes: Secondary | ICD-10-CM

## 2019-01-28 MED ORDER — DICYCLOMINE HCL 10 MG PO CAPS: 10.0000 mg | ORAL_CAPSULE | Freq: Three times a day (TID) | ORAL | 5 refills | Status: DC

## 2019-01-28 NOTE — Progress Notes (Signed)
Date:  01/28/2019   Name:  Meagan Gordon   DOB:  18-Jul-1966   MRN:  [id number]   Chief Complaint: Depression (Pre-Diabetes. - feels stress from husband and daughter having health issues as well as parents having some issues. )  Diabetes  She presents for her follow-up diabetic visit. Diabetes type: prediabetes. Hypoglycemia symptoms include nervousness/anxiousness. Pertinent negatives for hypoglycemia include no tremors. Pertinent negatives for diabetes include no chest pain, no polydipsia and no polyuria. Current diabetic treatment includes diet. She is compliant with treatment most of the time. Her weight is stable.  Abdominal Pain  This is a chronic problem. The problem has been gradually improving. The pain is located in the LLQ. The pain is mild. The quality of the pain is colicky and cramping. Pertinent negatives include no arthralgias, constipation, diarrhea, dysuria, fever, hematuria or vomiting. Treatments tried: bentyl. The treatment provided significant relief.  Mood disorder - she has been stressed recently with illness in several family members.  She is main one to care for everyone and she is feeling stressed.  She is not depressed and she has a positive attitude.  She does have medication prescribed to her by previous MD that she never took   Review of Systems  Constitutional: Negative for chills, fever and unexpected weight change.  HENT: Negative for tinnitus and trouble swallowing.   Eyes: Negative for visual disturbance.  Respiratory: Negative for cough, chest tightness and shortness of breath.   Cardiovascular: Negative for chest pain, palpitations and leg swelling.  Gastrointestinal: Positive for abdominal pain. Negative for constipation, diarrhea and vomiting.  Endocrine: Negative for polydipsia and polyuria.  Genitourinary: Negative for dysuria and hematuria.  Musculoskeletal: Negative for arthralgias.  Skin: Negative for color change and rash.  Neurological:  Negative for tremors and numbness.  Psychiatric/Behavioral: Negative for dysphoric mood and sleep disturbance. The patient is nervous/anxious.     Patient Active Problem List   Diagnosis Date Noted  . Neck muscle spasm 11/08/2018  . Tendonitis 07/09/2018  . Plantar fasciitis of left foot 07/09/2018  . PCOS (polycystic ovarian syndrome) 05/28/2018  . Gastroesophageal reflux disease without esophagitis 05/28/2018  . Abdominal pain, RUQ 05/28/2018  . Breast mass, right 07/19/2016  . Prediabetes 06/17/2015  . Abnormal uterine bleeding 06/13/2015    No Known Allergies  Past Surgical History:  Procedure Laterality Date  . CESAREAN SECTION  2000  . LEEP  1996    Social History   Tobacco Use  . Smoking status: Never Smoker  . Smokeless tobacco: Never Used  Substance Use Topics  . Alcohol use: Yes    Comment: ocassional  . Drug use: No     Medication list has been reviewed and updated.  Current Meds  Medication Sig  . dicyclomine (BENTYL) 10 MG capsule Take 1 capsule (10 mg total) by mouth 3 (three) times daily before meals.  Marland Kitchen levonorgestrel (MIRENA) 20 MCG/24HR IUD by Intrauterine route.  . lidocaine (LIDODERM) 5 % Place 1 patch onto the skin every 12 (twelve) hours. Remove & Discard patch within 12 hours or as directed by MD  . meloxicam (MOBIC) 15 MG tablet TAKE 1 TABLET BY MOUTH EVERY DAY  . omeprazole (PRILOSEC) 20 MG capsule Take 1 capsule (20 mg total) by mouth 2 (two) times daily before a meal.  . spironolactone (ALDACTONE) 50 MG tablet Take 1 tablet (50 mg total) by mouth 2 (two) times daily.  . [DISCONTINUED] dicyclomine (BENTYL) 10 MG capsule Take 1 capsule (10 mg total)  by mouth 3 (three) times daily before meals.    PHQ 2/9 Scores 01/28/2019 12/20/2018 05/28/2018  PHQ - 2 Score 2 0 0  PHQ- 9 Score 6 - -    Physical Exam Vitals signs and nursing note reviewed.  Constitutional:      General: She is not in acute distress.    Appearance: She is well-developed.   HENT:     Head: Normocephalic and atraumatic.  Neck:     Musculoskeletal: Normal range of motion and neck supple.  Cardiovascular:     Rate and Rhythm: Normal rate and regular rhythm.     Pulses: Normal pulses.  Pulmonary:     Effort: Pulmonary effort is normal. No respiratory distress.     Breath sounds: Normal breath sounds.  Abdominal:     General: Abdomen is flat. Bowel sounds are normal. There is no distension.     Palpations: There is no mass.     Tenderness: There is abdominal tenderness in the suprapubic area. There is no guarding or rebound.  Musculoskeletal: Normal range of motion.     Right lower leg: No edema.     Left lower leg: No edema.  Skin:    General: Skin is warm and dry.     Findings: No rash.  Neurological:     Mental Status: She is alert and oriented to person, place, and time.  Psychiatric:        Behavior: Behavior normal.        Thought Content: Thought content normal.     Wt Readings from Last 3 Encounters:  01/28/19 148 lb (67.1 kg)  12/20/18 150 lb (68 kg)  11/08/18 148 lb (67.1 kg)    BP 132/80   Pulse 89   Resp 16   Ht 5\' 2"  (1.575 m)   Wt 148 lb (67.1 kg)   SpO2 98%   BMI 27.07 kg/m   Assessment and Plan: 1. Abdominal pain, RUQ Doing much better on bentyl. - dicyclomine (BENTYL) 10 MG capsule; Take 1 capsule (10 mg total) by mouth 3 (three) times daily before meals.  Dispense: 90 capsule; Refill: 5 - Comprehensive metabolic panel - CBC with Differential/Platelet  2. Prediabetes Continue healthy diet, exercise - Hemoglobin A1c - TSH  3. Mood disorder (HCC) Consider medication if worsening - she can message me with her Rx at home for advise and dosing instructions.   Partially dictated using Animal nutritionist. Any errors are unintentional.  Bari Edward, MD [facility]  01/28/2019

## 2019-01-29 LAB — HEMOGLOBIN A1C
Est. average glucose Bld gHb Est-mCnc: 134 mg/dL
HEMOGLOBIN A1C: 6.3 % — AB (ref 4.8–5.6)

## 2019-01-29 LAB — TSH: TSH: 1.24 u[IU]/mL (ref 0.450–4.500)

## 2019-01-29 LAB — COMPREHENSIVE METABOLIC PANEL
A/G RATIO: 1.7 (ref 1.2–2.2)
ALBUMIN: 4.6 g/dL (ref 3.8–4.8)
ALK PHOS: 131 IU/L — AB (ref 39–117)
ALT: 14 IU/L (ref 0–32)
AST: 15 IU/L (ref 0–40)
BUN/Creatinine Ratio: 10 (ref 9–23)
BUN: 8 mg/dL (ref 6–24)
Bilirubin Total: 0.3 mg/dL (ref 0.0–1.2)
CHLORIDE: 100 mmol/L (ref 96–106)
CO2: 23 mmol/L (ref 20–29)
CREATININE: 0.77 mg/dL (ref 0.57–1.00)
Calcium: 9.7 mg/dL (ref 8.7–10.2)
GFR calc Af Amer: 104 mL/min/{1.73_m2} (ref >59)
GFR calc non Af Amer: 90 mL/min/{1.73_m2} (ref >59)
GLOBULIN, TOTAL: 2.7 g/dL (ref 1.5–4.5)
Glucose: 125 mg/dL — ABNORMAL HIGH (ref 65–99)
POTASSIUM: 4.4 mmol/L (ref 3.5–5.2)
SODIUM: 137 mmol/L (ref 134–144)
Total Protein: 7.3 g/dL (ref 6.0–8.5)

## 2019-01-29 LAB — CBC WITH DIFFERENTIAL/PLATELET
BASOS ABS: 0 10*3/uL (ref 0.0–0.2)
Basos: 0 %
EOS (ABSOLUTE): 0.1 10*3/uL (ref 0.0–0.4)
Eos: 1 %
Hematocrit: 41.1 % (ref 34.0–46.6)
Hemoglobin: 13.9 g/dL (ref 11.1–15.9)
Immature Grans (Abs): 0 10*3/uL (ref 0.0–0.1)
Immature Granulocytes: 0 %
LYMPHS ABS: 1.7 10*3/uL (ref 0.7–3.1)
Lymphs: 22 %
MCH: 31 pg (ref 26.6–33.0)
MCHC: 33.8 g/dL (ref 31.5–35.7)
MCV: 92 fL (ref 79–97)
MONOCYTES: 9 %
MONOS ABS: 0.7 10*3/uL (ref 0.1–0.9)
Neutrophils Absolute: 5.1 10*3/uL (ref 1.4–7.0)
Neutrophils: 68 %
PLATELETS: 322 10*3/uL (ref 150–450)
RBC: 4.48 x10E6/uL (ref 3.77–5.28)
RDW: 12.7 % (ref 11.7–15.4)
WBC: 7.6 10*3/uL (ref 3.4–10.8)

## 2019-02-25 ENCOUNTER — Other Ambulatory Visit: Payer: Self-pay | Admitting: Internal Medicine

## 2019-02-25 DIAGNOSIS — M722 Plantar fascial fibromatosis: Secondary | ICD-10-CM

## 2019-02-25 DIAGNOSIS — M779 Enthesopathy, unspecified: Secondary | ICD-10-CM

## 2019-03-09 ENCOUNTER — Encounter: Payer: Self-pay | Admitting: Internal Medicine

## 2019-03-09 NOTE — Telephone Encounter (Signed)
Please advise patient medication question?

## 2019-03-24 ENCOUNTER — Other Ambulatory Visit: Payer: Self-pay | Admitting: Internal Medicine

## 2019-03-24 DIAGNOSIS — K219 Gastro-esophageal reflux disease without esophagitis: Secondary | ICD-10-CM

## 2019-05-24 ENCOUNTER — Encounter: Payer: Self-pay | Admitting: Internal Medicine

## 2019-06-26 ENCOUNTER — Other Ambulatory Visit: Payer: Self-pay | Admitting: Internal Medicine

## 2019-06-26 DIAGNOSIS — M722 Plantar fascial fibromatosis: Secondary | ICD-10-CM

## 2019-06-26 DIAGNOSIS — M779 Enthesopathy, unspecified: Secondary | ICD-10-CM

## 2019-07-12 ENCOUNTER — Ambulatory Visit (INDEPENDENT_AMBULATORY_CARE_PROVIDER_SITE_OTHER): Payer: BC Managed Care – PPO | Admitting: Internal Medicine

## 2019-07-12 ENCOUNTER — Encounter: Payer: Self-pay | Admitting: Internal Medicine

## 2019-07-12 ENCOUNTER — Other Ambulatory Visit: Payer: Self-pay

## 2019-07-12 ENCOUNTER — Other Ambulatory Visit: Payer: Self-pay | Admitting: Internal Medicine

## 2019-07-12 VITALS — BP 117/77 | HR 92 | Resp 16 | Ht 62.0 in | Wt 150.0 lb

## 2019-07-12 DIAGNOSIS — N898 Other specified noninflammatory disorders of vagina: Secondary | ICD-10-CM

## 2019-07-12 DIAGNOSIS — Z0000 Encounter for general adult medical examination without abnormal findings: Secondary | ICD-10-CM | POA: Diagnosis not present

## 2019-07-12 DIAGNOSIS — R7303 Prediabetes: Secondary | ICD-10-CM | POA: Diagnosis not present

## 2019-07-12 DIAGNOSIS — E282 Polycystic ovarian syndrome: Secondary | ICD-10-CM

## 2019-07-12 DIAGNOSIS — K219 Gastro-esophageal reflux disease without esophagitis: Secondary | ICD-10-CM | POA: Diagnosis not present

## 2019-07-12 DIAGNOSIS — Z1231 Encounter for screening mammogram for malignant neoplasm of breast: Secondary | ICD-10-CM | POA: Diagnosis not present

## 2019-07-12 DIAGNOSIS — Z1211 Encounter for screening for malignant neoplasm of colon: Secondary | ICD-10-CM

## 2019-07-12 LAB — POCT URINALYSIS DIPSTICK
Bilirubin, UA: NEGATIVE
Blood, UA: NEGATIVE
Glucose, UA: NEGATIVE
Ketones, UA: NEGATIVE
Leukocytes, UA: NEGATIVE
Nitrite, UA: NEGATIVE
Protein, UA: NEGATIVE
Spec Grav, UA: 1.01 (ref 1.010–1.025)
Urobilinogen, UA: 0.2 E.U./dL
pH, UA: 7 (ref 5.0–8.0)

## 2019-07-12 LAB — POCT WET PREP WITH KOH
KOH Prep POC: NEGATIVE
Trichomonas, UA: NEGATIVE

## 2019-07-12 MED ORDER — METRONIDAZOLE 500 MG PO TABS: 500.0000 mg | ORAL_TABLET | Freq: Two times a day (BID) | ORAL | 0 refills | Status: DC

## 2019-07-12 NOTE — Progress Notes (Signed)
Date:  07/12/2019   Name:  Meagan Gordon   DOB:  03-05-1966   MRN:  [id number]   Chief Complaint: Annual Exam Lucile Coch is a 53 y.o. female who presents today for her Complete Annual Exam. She feels well. She reports exercising walking. She reports she is sleeping poorly.   Mammogram - due Pap 2018 neg with cotesting Colonoscopy - referred 12/2018 but pt wants to wait  Gastroesophageal Reflux She complains of heartburn and water brash. She reports no abdominal pain, no chest pain, no choking, no coughing or no wheezing. This is a recurrent problem. The problem occurs rarely. Pertinent negatives include no fatigue. She has tried a PPI for the symptoms. The treatment provided significant relief.  Diabetes She presents for her follow-up diabetic visit. Diabetes type: pre-diabetes. Her disease course has been fluctuating. Pertinent negatives for hypoglycemia include no dizziness, headaches, nervousness/anxiousness or tremors. Pertinent negatives for diabetes include no chest pain, no fatigue, no polydipsia and no polyuria. Symptoms are stable. Current diabetic treatment includes oral agent (monotherapy). Her weight is stable. She is following a generally healthy diet. She participates in exercise intermittently. She monitors blood glucose at home 1-2 x per week. Her breakfast blood glucose is taken between 9-10 am. Her dinner blood glucose is taken between 5-6 pm. Her dinner blood glucose range is generally 140-180 mg/dl. An ACE inhibitor/angiotensin II receptor blocker is not being taken.  Vaginal Itching The patient's primary symptoms include genital itching, a genital odor and vaginal discharge. The patient's pertinent negatives include no missed menses or pelvic pain. This is a new problem. The current episode started in the past 7 days. The problem has been gradually improving. Pertinent negatives include no abdominal pain, chills, constipation, diarrhea, dysuria, fever, frequency, headaches,  rash or vomiting. The vaginal bleeding is spotting. She has not been passing clots. She has not been passing tissue.    Review of Systems  Constitutional: Negative for chills, fatigue and fever.  HENT: Negative for congestion, hearing loss, tinnitus, trouble swallowing and voice change.   Eyes: Negative for visual disturbance.  Respiratory: Negative for cough, choking, chest tightness, shortness of breath and wheezing.   Cardiovascular: Negative for chest pain, palpitations and leg swelling.  Gastrointestinal: Positive for heartburn. Negative for abdominal pain, constipation, diarrhea and vomiting.  Endocrine: Negative for polydipsia and polyuria.  Genitourinary: Positive for vaginal discharge. Negative for dysuria, frequency, genital sores, missed menses, pelvic pain and vaginal bleeding.  Musculoskeletal: Negative for arthralgias, gait problem and joint swelling.  Skin: Negative for color change and rash.  Neurological: Positive for numbness (intermittent foot pain). Negative for dizziness, tremors, light-headedness and headaches.  Hematological: Negative for adenopathy. Does not bruise/bleed easily.  Psychiatric/Behavioral: Negative for dysphoric mood and sleep disturbance. The patient is not nervous/anxious.     Patient Active Problem List   Diagnosis Date Noted  . Neck muscle spasm 11/08/2018  . Tendonitis 07/09/2018  . Plantar fasciitis of left foot 07/09/2018  . PCOS (polycystic ovarian syndrome) 05/28/2018  . Gastroesophageal reflux disease without esophagitis 05/28/2018  . Abdominal pain, RUQ 05/28/2018  . Breast mass, right 07/19/2016  . Prediabetes 06/17/2015  . Abnormal uterine bleeding 06/13/2015    No Known Allergies  Past Surgical History:  Procedure Laterality Date  . CESAREAN SECTION  2000  . LEEP  1996    Social History   Tobacco Use  . Smoking status: Never Smoker  . Smokeless tobacco: Never Used  Substance Use Topics  . Alcohol use: Yes  Comment:  ocassional  . Drug use: No     Medication list has been reviewed and updated.  Current Meds  Medication Sig  . dicyclomine (BENTYL) 10 MG capsule Take 1 capsule (10 mg total) by mouth 3 (three) times daily before meals.  Marland Kitchen levonorgestrel (MIRENA) 20 MCG/24HR IUD by Intrauterine route.  . meloxicam (MOBIC) 15 MG tablet TAKE 1 TABLET BY MOUTH EVERY DAY  . metFORMIN (GLUCOPHAGE) 500 MG tablet Take 500 mg by mouth daily with breakfast.   . omeprazole (PRILOSEC) 20 MG capsule TAKE 1 CAPSULE (20 MG TOTAL) BY MOUTH 2 (TWO) TIMES DAILY BEFORE A MEAL.  Marland Kitchen spironolactone (ALDACTONE) 50 MG tablet Take 1 tablet (50 mg total) by mouth 2 (two) times daily.    PHQ 2/9 Scores 07/12/2019 01/28/2019 12/20/2018 05/28/2018  PHQ - 2 Score 0 2 0 0  PHQ- 9 Score - 6 - -    BP Readings from Last 3 Encounters:  07/12/19 117/77  01/28/19 132/80  12/20/18 130/80    Physical Exam Vitals signs and nursing note reviewed.  Constitutional:      General: She is not in acute distress.    Appearance: She is well-developed.  HENT:     Head: Normocephalic and atraumatic.     Right Ear: Tympanic membrane and ear canal normal.     Left Ear: Tympanic membrane and ear canal normal.     Nose:     Right Sinus: No maxillary sinus tenderness.     Left Sinus: No maxillary sinus tenderness.  Eyes:     General: No scleral icterus.       Right eye: No discharge.        Left eye: No discharge.     Conjunctiva/sclera: Conjunctivae normal.  Neck:     Musculoskeletal: Normal range of motion. No erythema.     Thyroid: No thyromegaly.     Vascular: No carotid bruit.  Cardiovascular:     Rate and Rhythm: Normal rate and regular rhythm.     Pulses: Normal pulses.     Heart sounds: Normal heart sounds.  Pulmonary:     Effort: Pulmonary effort is normal. No respiratory distress.     Breath sounds: No wheezing.  Chest:     Breasts:        Right: No mass, nipple discharge, skin change or tenderness.        Left: No mass,  nipple discharge, skin change or tenderness.  Abdominal:     General: Bowel sounds are normal.     Palpations: Abdomen is soft.     Tenderness: There is no abdominal tenderness.  Genitourinary:    Labia:        Right: No tenderness, lesion or injury.        Left: No tenderness, lesion or injury.      Vagina: Vaginal discharge (frothy yellow discharge) present.  Musculoskeletal: Normal range of motion.  Lymphadenopathy:     Cervical: No cervical adenopathy.     Lower Body: No right inguinal adenopathy. No left inguinal adenopathy.  Skin:    General: Skin is warm and dry.     Findings: No rash.  Neurological:     Mental Status: She is alert and oriented to person, place, and time.     Cranial Nerves: No cranial nerve deficit.     Sensory: No sensory deficit.     Deep Tendon Reflexes: Reflexes are normal and symmetric.  Psychiatric:        Speech:  Speech normal.        Behavior: Behavior normal.        Thought Content: Thought content normal.     Wt Readings from Last 3 Encounters:  07/12/19 150 lb (68 kg)  01/28/19 148 lb (67.1 kg)  12/20/18 150 lb (68 kg)    BP 117/77   Pulse 92   Resp 16   Ht 5\' 2"  (1.575 m)   Wt 150 lb (68 kg)   SpO2 97%   BMI 27.44 kg/m   Assessment and Plan: 1. Annual physical exam Normal exam except for weight Recommend more regular exercise for BS and weight control She declines flu vaccine today - Lipid panel - TSH - POCT urinalysis dipstick  2. Encounter for screening mammogram for breast cancer - MM 3D SCREEN BREAST BILATERAL; Future  3. Colon cancer screening Pt does not want to do colonoscopy at this time due to her husband having colostomy reversal and treatment for colon cancer - Fecal occult blood, imunochemical  4. Gastroesophageal reflux disease without esophagitis Symptoms well controlled on daily PPI No red flag signs such as weight loss, n/v, melena Will continue omeprazole daily. - CBC with Differential/Platelet  5.  Prediabetes BS have been increasing gradually - will check A1C and advise on medication change If >6.5, she will also need PPV-23 and consideration of ACEI - Comprehensive metabolic panel - Hemoglobin A1c - Microalbumin / creatinine urine ratio  6. PCOS (polycystic ovarian syndrome) Stable; IUD in place  7. Vaginal discharge Bacterial and clue cells on exam - POCT Wet Prep with KOH - metroNIDAZOLE (FLAGYL) 500 MG tablet; Take 1 tablet (500 mg total) by mouth 2 (two) times daily for 7 days.  Dispense: 14 tablet; Refill: 0   Partially dictated using Animal nutritionistDragon software. Any errors are unintentional.  Bari EdwardLaura Perrie Ragin, MD [facility]  07/12/2019

## 2019-07-13 LAB — CBC WITH DIFFERENTIAL/PLATELET
Basophils Absolute: 0 10*3/uL (ref 0.0–0.2)
Basos: 0 %
EOS (ABSOLUTE): 0.1 10*3/uL (ref 0.0–0.4)
Eos: 1 %
Hematocrit: 42.2 % (ref 34.0–46.6)
Hemoglobin: 14 g/dL (ref 11.1–15.9)
Immature Grans (Abs): 0 10*3/uL (ref 0.0–0.1)
Immature Granulocytes: 0 %
Lymphocytes Absolute: 1.5 10*3/uL (ref 0.7–3.1)
Lymphs: 20 %
MCH: 30.4 pg (ref 26.6–33.0)
MCHC: 33.2 g/dL (ref 31.5–35.7)
MCV: 92 fL (ref 79–97)
Monocytes Absolute: 0.6 10*3/uL (ref 0.1–0.9)
Monocytes: 8 %
Neutrophils Absolute: 5.3 10*3/uL (ref 1.4–7.0)
Neutrophils: 71 %
Platelets: 348 10*3/uL (ref 150–450)
RBC: 4.61 x10E6/uL (ref 3.77–5.28)
RDW: 12.5 % (ref 11.7–15.4)
WBC: 7.5 10*3/uL (ref 3.4–10.8)

## 2019-07-13 LAB — COMPREHENSIVE METABOLIC PANEL
ALT: 11 IU/L (ref 0–32)
AST: 15 IU/L (ref 0–40)
Albumin/Globulin Ratio: 1.8 (ref 1.2–2.2)
Albumin: 4.5 g/dL (ref 3.8–4.9)
Alkaline Phosphatase: 129 IU/L — ABNORMAL HIGH (ref 39–117)
BUN/Creatinine Ratio: 11 (ref 9–23)
BUN: 8 mg/dL (ref 6–24)
Bilirubin Total: 0.3 mg/dL (ref 0.0–1.2)
CO2: 24 mmol/L (ref 20–29)
Calcium: 9.4 mg/dL (ref 8.7–10.2)
Chloride: 99 mmol/L (ref 96–106)
Creatinine, Ser: 0.76 mg/dL (ref 0.57–1.00)
GFR calc Af Amer: 105 mL/min/{1.73_m2} (ref >59)
GFR calc non Af Amer: 91 mL/min/{1.73_m2} (ref >59)
Globulin, Total: 2.5 g/dL (ref 1.5–4.5)
Glucose: 139 mg/dL — ABNORMAL HIGH (ref 65–99)
Potassium: 4.4 mmol/L (ref 3.5–5.2)
Sodium: 138 mmol/L (ref 134–144)
Total Protein: 7 g/dL (ref 6.0–8.5)

## 2019-07-13 LAB — LIPID PANEL
Chol/HDL Ratio: 4.5 ratio — ABNORMAL HIGH (ref 0.0–4.4)
Cholesterol, Total: 191 mg/dL (ref 100–199)
HDL: 42 mg/dL (ref >39)
LDL Chol Calc (NIH): 134 mg/dL — ABNORMAL HIGH (ref 0–99)
Triglycerides: 81 mg/dL (ref 0–149)
VLDL Cholesterol Cal: 15 mg/dL (ref 5–40)

## 2019-07-13 LAB — TSH: TSH: 2.09 u[IU]/mL (ref 0.450–4.500)

## 2019-07-13 LAB — HEMOGLOBIN A1C
Est. average glucose Bld gHb Est-mCnc: 128 mg/dL
Hgb A1c MFr Bld: 6.1 % — ABNORMAL HIGH (ref 4.8–5.6)

## 2019-07-13 LAB — MICROALBUMIN / CREATININE URINE RATIO
Creatinine, Urine: 160.5 mg/dL
Microalb/Creat Ratio: 6 mg/g creat (ref 0–29)
Microalbumin, Urine: 9.1 ug/mL

## 2019-07-14 ENCOUNTER — Other Ambulatory Visit: Payer: Self-pay | Admitting: Internal Medicine

## 2019-07-14 DIAGNOSIS — B9689 Other specified bacterial agents as the cause of diseases classified elsewhere: Secondary | ICD-10-CM

## 2019-07-14 MED ORDER — METRONIDAZOLE 0.75 % VA GEL: 1.0000 | Freq: Two times a day (BID) | VAGINAL | 0 refills | Status: DC

## 2019-07-14 NOTE — Progress Notes (Signed)
Patient informed of labs through my chart. Said the Flagyl pills that were sent at her CPE are too big to swallow. She is unable to get them down. She wanted to know if there is something else she can try OR liquid form she can take?  Please advise.

## 2019-07-23 ENCOUNTER — Other Ambulatory Visit: Payer: Self-pay | Admitting: Internal Medicine

## 2019-07-23 DIAGNOSIS — R1011 Right upper quadrant pain: Secondary | ICD-10-CM

## 2019-08-01 ENCOUNTER — Encounter: Payer: Self-pay | Admitting: Internal Medicine

## 2019-08-01 ENCOUNTER — Other Ambulatory Visit: Payer: Self-pay

## 2019-08-01 ENCOUNTER — Ambulatory Visit: Payer: BC Managed Care – PPO | Admitting: Internal Medicine

## 2019-08-01 VITALS — BP 128/72 | HR 96 | Temp 98.4°F | Ht 62.0 in | Wt 151.0 lb

## 2019-08-01 DIAGNOSIS — R3915 Urgency of urination: Secondary | ICD-10-CM

## 2019-08-01 LAB — POC URINALYSIS WITH MICROSCOPIC (NON AUTO)MANUAL RESULT
Bilirubin, UA: NEGATIVE
Crystals: 0
Epithelial cells, urine per micros: 0
Glucose, UA: NEGATIVE
Ketones, UA: NEGATIVE
Mucus, UA: 0
Nitrite, UA: NEGATIVE
Protein, UA: NEGATIVE
RBC: 2 M/uL — AB (ref 4.04–5.48)
Spec Grav, UA: 1.01 (ref 1.010–1.025)
Urobilinogen, UA: 0.2 E.U./dL
WBC Casts, UA: 3
pH, UA: 6.5 (ref 5.0–8.0)

## 2019-08-01 MED ORDER — SULFAMETHOXAZOLE-TRIMETHOPRIM 800-160 MG PO TABS: 1.0000 | ORAL_TABLET | Freq: Two times a day (BID) | ORAL | 0 refills | Status: AC

## 2019-08-01 NOTE — Progress Notes (Signed)
Date:  08/01/2019   Name:  Meagan FarrierRhonda Griffiths   DOB:  03/19/1966   MRN:  [id number]   Chief Complaint: Urinary Tract Infection (Started Saturday- 09/20. Started cranberry juice and water. Slight burning and urgency. Hematuria yesterday. )  Urinary Tract Infection  This is a new problem. The current episode started yesterday. The problem occurs every urination. The problem has been unchanged. The quality of the pain is described as burning. The pain is mild. There has been no fever. Associated symptoms include frequency, hematuria and urgency. Pertinent negatives include no chills, flank pain, nausea or vomiting. She has tried increased fluids for the symptoms. The treatment provided no relief.    Review of Systems  Constitutional: Negative for chills and fever.  Cardiovascular: Negative for chest pain and palpitations.  Gastrointestinal: Negative for diarrhea, nausea and vomiting.  Genitourinary: Positive for frequency, hematuria and urgency. Negative for flank pain.  Musculoskeletal: Negative for back pain.    Patient Active Problem List   Diagnosis Date Noted  . Neck muscle spasm 11/08/2018  . Tendonitis 07/09/2018  . Plantar fasciitis of left foot 07/09/2018  . PCOS (polycystic ovarian syndrome) 05/28/2018  . Gastroesophageal reflux disease without esophagitis 05/28/2018  . Abdominal pain, RUQ 05/28/2018  . Breast mass, right 07/19/2016  . Prediabetes 06/17/2015  . Abnormal uterine bleeding 06/13/2015    No Known Allergies  Past Surgical History:  Procedure Laterality Date  . CESAREAN SECTION  2000  . LEEP  1996    Social History   Tobacco Use  . Smoking status: Never Smoker  . Smokeless tobacco: Never Used  Substance Use Topics  . Alcohol use: Yes    Comment: ocassional  . Drug use: No     Medication list has been reviewed and updated.  Current Meds  Medication Sig  . dicyclomine (BENTYL) 10 MG capsule TAKE 1 CAPSULE (10 MG TOTAL) BY MOUTH 3 (THREE) TIMES  DAILY BEFORE MEALS.  Marland Kitchen. levonorgestrel (MIRENA) 20 MCG/24HR IUD by Intrauterine route.  . meloxicam (MOBIC) 15 MG tablet TAKE 1 TABLET BY MOUTH EVERY DAY  . metFORMIN (GLUCOPHAGE) 500 MG tablet Take 500 mg by mouth daily with breakfast.   . metroNIDAZOLE (METROGEL VAGINAL) 0.75 % vaginal gel Place 1 Applicatorful vaginally 2 (two) times daily.  Marland Kitchen. omeprazole (PRILOSEC) 20 MG capsule TAKE 1 CAPSULE (20 MG TOTAL) BY MOUTH 2 (TWO) TIMES DAILY BEFORE A MEAL.  Marland Kitchen. spironolactone (ALDACTONE) 50 MG tablet Take 1 tablet (50 mg total) by mouth 2 (two) times daily.    PHQ 2/9 Scores 08/01/2019 07/12/2019 01/28/2019 12/20/2018  PHQ - 2 Score 0 0 2 0  PHQ- 9 Score - - 6 -    BP Readings from Last 3 Encounters:  08/01/19 128/72  07/12/19 117/77  01/28/19 132/80    Physical Exam Vitals signs and nursing note reviewed.  Constitutional:      Appearance: She is well-developed.  Neck:     Musculoskeletal: Normal range of motion.  Cardiovascular:     Rate and Rhythm: Normal rate and regular rhythm.     Heart sounds: Normal heart sounds.  Pulmonary:     Effort: Pulmonary effort is normal. No respiratory distress.     Breath sounds: Normal breath sounds.  Abdominal:     General: Bowel sounds are normal.     Palpations: Abdomen is soft.     Tenderness: There is abdominal tenderness in the suprapubic area. There is no guarding or rebound.  Lymphadenopathy:  Cervical: No cervical adenopathy.     Wt Readings from Last 3 Encounters:  08/01/19 151 lb (68.5 kg)  07/12/19 150 lb (68 kg)  01/28/19 148 lb (67.1 kg)    BP 128/72   Pulse 96   Temp 98.4 F (36.9 C) (Oral)   Ht 5\' 2"  (1.575 m)   Wt 151 lb (68.5 kg)   SpO2 99%   BMI 27.62 kg/m   Assessment and Plan: 1. Urgency of urination Continue to push fluids - POC urinalysis w microscopic (non auto) - sulfamethoxazole-trimethoprim (BACTRIM DS) 800-160 MG tablet; Take 1 tablet by mouth 2 (two) times daily for 3 days.  Dispense: 6 tablet;  Refill: 0   Partially dictated using Editor, commissioning. Any errors are unintentional.  Halina Maidens, MD [facility]  08/01/2019

## 2019-08-02 ENCOUNTER — Encounter: Payer: Self-pay | Admitting: Internal Medicine

## 2019-08-09 ENCOUNTER — Other Ambulatory Visit: Payer: Self-pay | Admitting: Internal Medicine

## 2019-08-09 ENCOUNTER — Encounter: Payer: Self-pay | Admitting: Internal Medicine

## 2019-08-09 DIAGNOSIS — N644 Mastodynia: Secondary | ICD-10-CM

## 2019-08-09 DIAGNOSIS — R928 Other abnormal and inconclusive findings on diagnostic imaging of breast: Secondary | ICD-10-CM

## 2019-08-17 ENCOUNTER — Ambulatory Visit
Admission: RE | Admit: 2019-08-17 | Discharge: 2019-08-17 | Disposition: A | Discharge status: Home / Self Care | Payer: BC Managed Care – PPO | Source: Ambulatory Visit | Attending: Internal Medicine | Admitting: Internal Medicine

## 2019-08-17 DIAGNOSIS — R922 Inconclusive mammogram: Secondary | ICD-10-CM | POA: Diagnosis not present

## 2019-08-17 DIAGNOSIS — N644 Mastodynia: Secondary | ICD-10-CM | POA: Diagnosis not present

## 2019-08-17 DIAGNOSIS — R928 Other abnormal and inconclusive findings on diagnostic imaging of breast: Secondary | ICD-10-CM | POA: Insufficient documentation

## 2019-08-17 DIAGNOSIS — N6324 Unspecified lump in the left breast, lower inner quadrant: Secondary | ICD-10-CM | POA: Diagnosis not present

## 2019-08-17 DIAGNOSIS — N6322 Unspecified lump in the left breast, upper inner quadrant: Secondary | ICD-10-CM | POA: Diagnosis not present

## 2019-09-02 DIAGNOSIS — Z1211 Encounter for screening for malignant neoplasm of colon: Secondary | ICD-10-CM | POA: Diagnosis not present

## 2019-09-03 LAB — SPECIMEN STATUS REPORT

## 2019-09-03 LAB — FECAL OCCULT BLOOD, IMMUNOCHEMICAL: Fecal Occult Bld: NEGATIVE

## 2019-09-22 ENCOUNTER — Other Ambulatory Visit: Payer: Self-pay | Admitting: Internal Medicine

## 2019-09-22 DIAGNOSIS — K219 Gastro-esophageal reflux disease without esophagitis: Secondary | ICD-10-CM

## 2019-10-28 ENCOUNTER — Other Ambulatory Visit: Payer: Self-pay | Admitting: Internal Medicine

## 2019-10-28 DIAGNOSIS — E282 Polycystic ovarian syndrome: Secondary | ICD-10-CM

## 2019-11-24 IMAGING — MG MM DIGITAL DIAGNOSTIC UNILAT*L* W/ TOMO W/ CAD
6 series · 6 of 14 positions shown · non-contrast
Comparison: Previous exam(s).

CLINICAL DATA: Short-term follow-up for a probably benign small
nodule in the left breast noted sonographically. Original evaluation
of the left breast was for an area of asymmetry, which dispersed on
diagnostic imaging consistent with superimposed fibroglandular
tissue.

EXAM:
DIGITAL DIAGNOSTIC LEFT MAMMOGRAM WITH CAD AND TOMO
ULTRASOUND LEFT BREAST

[L CC synth-2D]
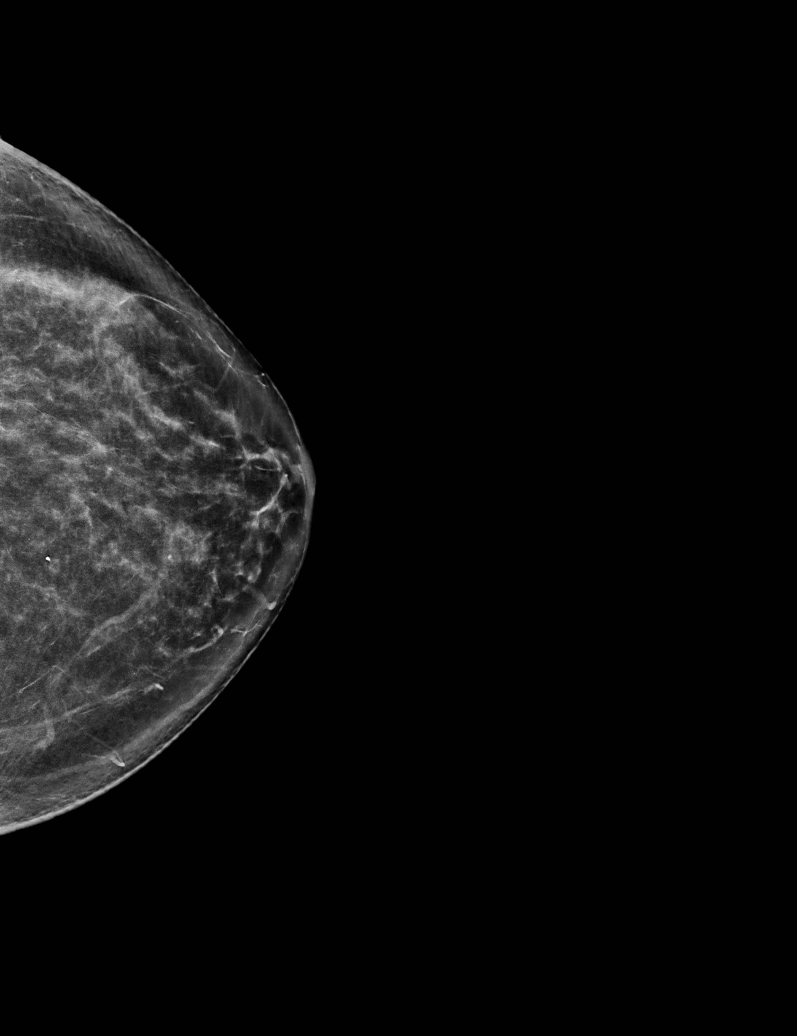

[L CC]
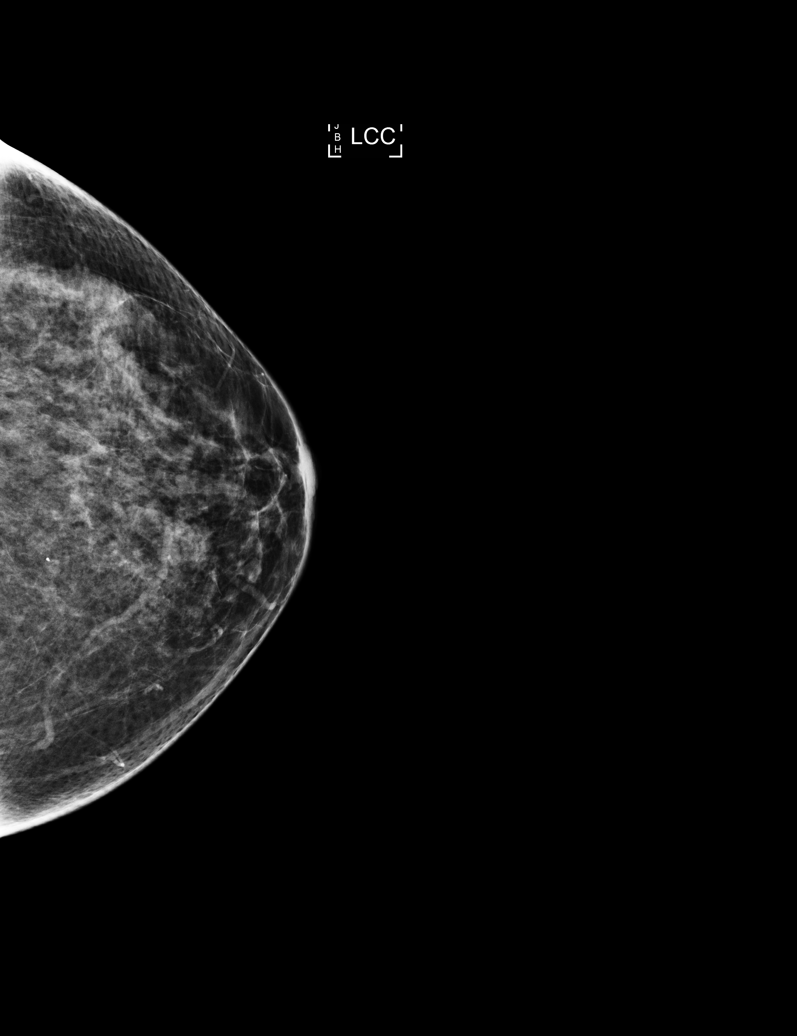

[L MLO synth-2D]
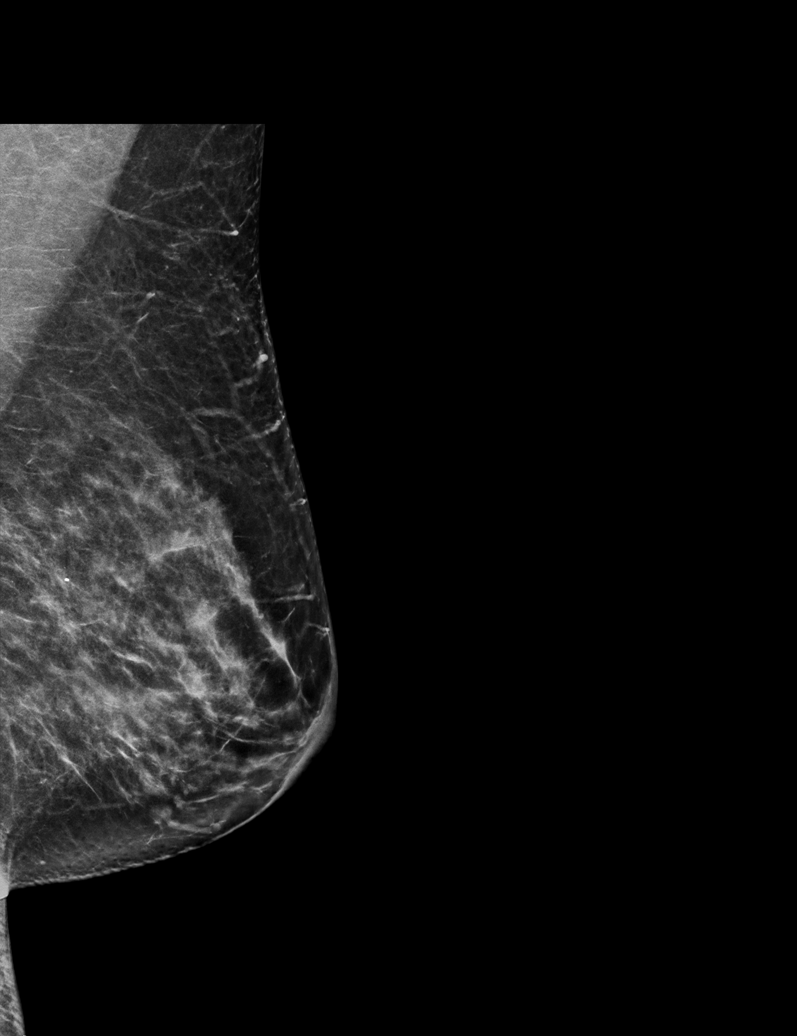

[L MLO]
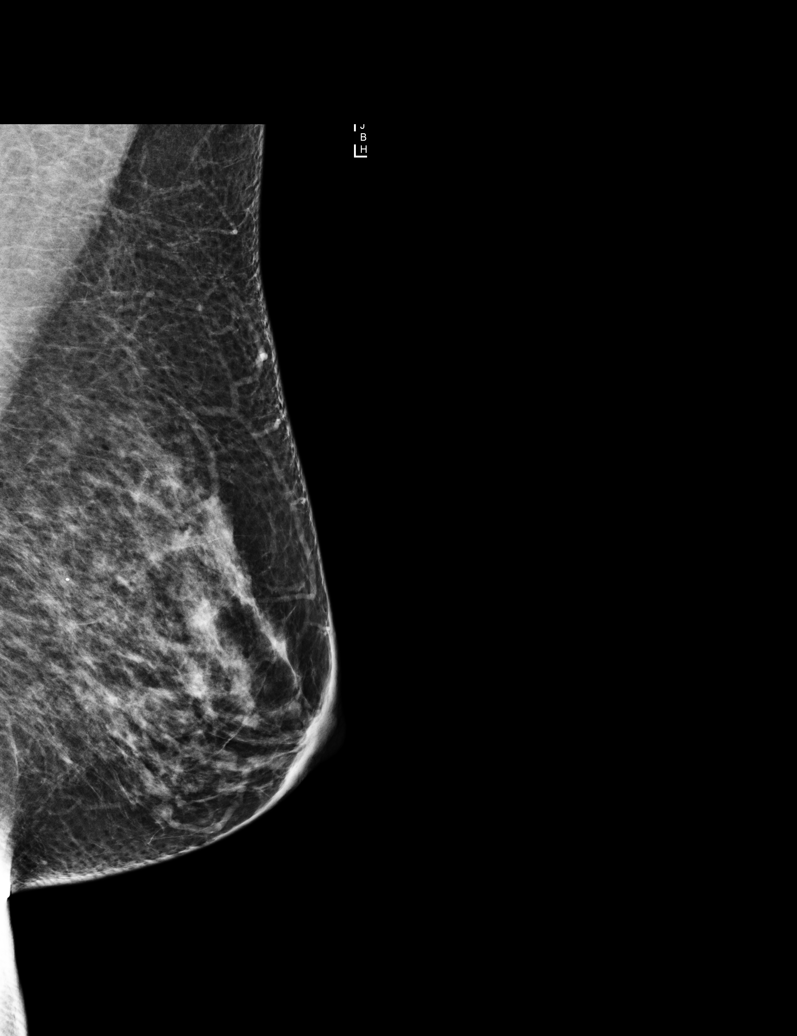

[L MLO tomo · tomo slice 35/69.0]
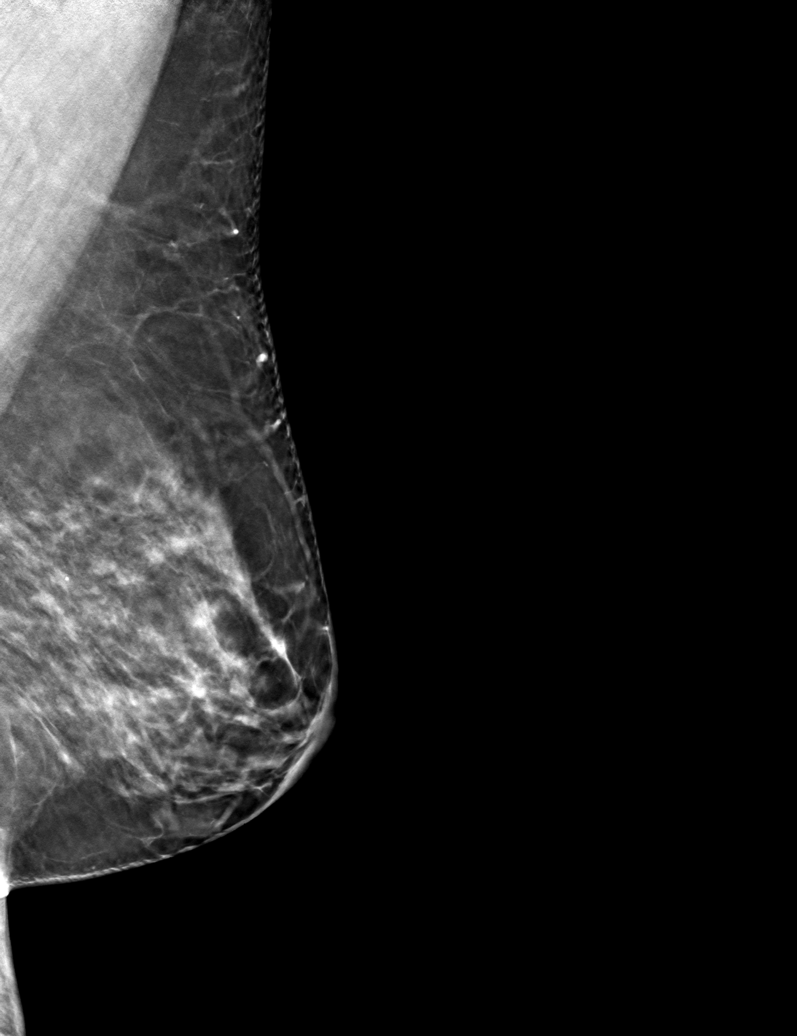

[L CC tomo · tomo slice 33/66.0]
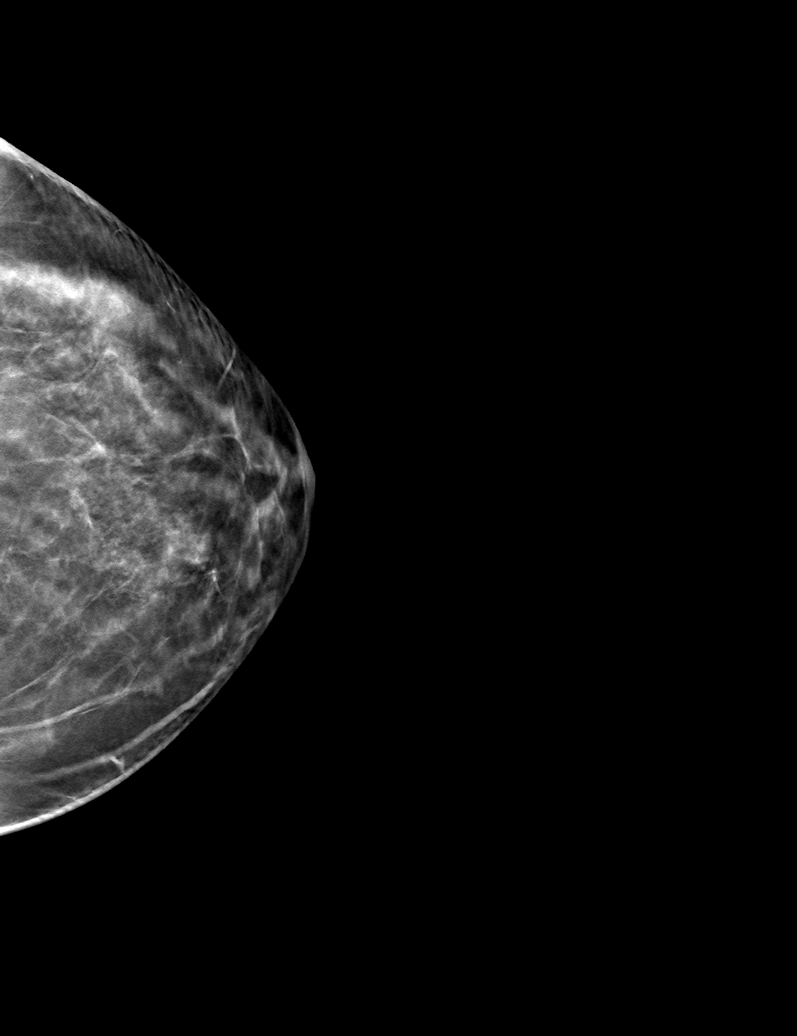

[6 of 14 positions shown; findings below may reference images not displayed]

ACR Breast Density Category c: The breast tissue is heterogeneously
dense, which may obscure small masses.
FINDINGS: The original area of asymmetry is less focal than on the study dated
07/06/2017. There are no new areas of asymmetry, no discrete masses
and no areas of architectural distortion. There are no suspicious
calcifications.

Mammographic images were processed with CAD.

Targeted ultrasound is performed, showing a small hypoechoic mass in
the left breast at 9 o'clock, 1 cm from the nipple, measuring 4.5 x
3 x 4 mm, unchanged from the prior exam.
IMPRESSION: 1. Probably benign small nodule in the left breast at 9 o'clock,
stable for 6 months. Additional short-term follow-up recommended.
2. No other abnormalities.

RECOMMENDATION:
Diagnostic mammography and left breast ultrasound in 6 months.

I have discussed the findings and recommendations with the patient.
Results were also provided in writing at the conclusion of the
visit. If applicable, a reminder letter will be sent to the patient
regarding the next appointment.

BI-RADS CATEGORY  3: Probably benign.

## 2019-11-29 ENCOUNTER — Ambulatory Visit: Payer: BC Managed Care – PPO | Attending: Internal Medicine

## 2019-11-29 DIAGNOSIS — Z20822 Contact with and (suspected) exposure to covid-19: Secondary | ICD-10-CM | POA: Diagnosis not present

## 2019-11-30 LAB — NOVEL CORONAVIRUS, NAA: SARS-CoV-2, NAA: DETECTED — AB

## 2019-12-01 ENCOUNTER — Telehealth (HOSPITAL_COMMUNITY): Payer: Self-pay | Admitting: Physician Assistant

## 2019-12-01 NOTE — Telephone Encounter (Signed)
Called to discuss with Meagan Gordon about Covid symptoms and the use of bamlanivimab, a monoclonal antibody infusion for those with mild to moderate Covid symptoms and at a high risk of hospitalization.     Patient is feeling okay and wishes to call back later to see if she qualifies or not.   Manson Passey, PA -C

## 2019-12-29 ENCOUNTER — Encounter: Payer: Self-pay | Admitting: Internal Medicine

## 2019-12-30 ENCOUNTER — Other Ambulatory Visit: Payer: Self-pay

## 2019-12-30 ENCOUNTER — Ambulatory Visit: Payer: BC Managed Care – PPO | Admitting: Internal Medicine

## 2019-12-30 ENCOUNTER — Encounter: Payer: Self-pay | Admitting: Internal Medicine

## 2019-12-30 VITALS — BP 118/78 | HR 95 | Temp 97.0°F | Ht 62.0 in | Wt 145.0 lb

## 2019-12-30 DIAGNOSIS — J0100 Acute maxillary sinusitis, unspecified: Secondary | ICD-10-CM

## 2019-12-30 DIAGNOSIS — E282 Polycystic ovarian syndrome: Secondary | ICD-10-CM

## 2019-12-30 DIAGNOSIS — N3000 Acute cystitis without hematuria: Secondary | ICD-10-CM | POA: Diagnosis not present

## 2019-12-30 LAB — POC URINALYSIS WITH MICROSCOPIC (NON AUTO)MANUAL RESULT
Bilirubin, UA: NEGATIVE
Blood, UA: NEGATIVE
Crystals: 0
Epithelial cells, urine per micros: 1
Glucose, UA: NEGATIVE
Ketones, UA: NEGATIVE
Mucus, UA: 0
Nitrite, UA: POSITIVE
Protein, UA: NEGATIVE
RBC: 0 M/uL — AB (ref 4.04–5.48)
Spec Grav, UA: 1.01 (ref 1.010–1.025)
Urobilinogen, UA: 0.2 E.U./dL
WBC Casts, UA: 2
pH, UA: 6.5 (ref 5.0–8.0)

## 2019-12-30 MED ORDER — AMOXICILLIN-POT CLAVULANATE 875-125 MG PO TABS: 1.0000 | ORAL_TABLET | Freq: Two times a day (BID) | ORAL | 0 refills | Status: AC

## 2019-12-30 NOTE — Progress Notes (Signed)
Date:  12/30/2019   Name:  Meagan Gordon   DOB:  17-Feb-1966   MRN:  [id number]   Chief Complaint: Urinary Tract Infection (Took AZO lastnight. Started X 2 weeks ago. Back pain, Urgency, sometimes only a little comes out but then she has to go again. ), Hypertension (Wants to discuss lowering spironalactone to once daily.), and Ear Pain (Right ear feels like has fluid in it- Started months ago. On and off pain. )  Urinary Tract Infection  This is a new problem. The current episode started in the past 7 days. The problem occurs every urination. The problem has been unchanged. The quality of the pain is described as burning. The pain is mild. There has been no fever. Associated symptoms include flank pain and urgency. Pertinent negatives include no chills, hematuria, nausea or vomiting. Treatments tried: AZO.  Otalgia  There is pain in the right ear. This is a recurrent problem. The problem has been waxing and waning. There has been no fever. Pertinent negatives include no abdominal pain, ear discharge, headaches, hearing loss, sore throat or vomiting.  PCOS - pt is taking spironolactone to reduce excessive hair growth.  She states that she also had borderline high bp at some point and was told that it would treat that as well.  She is interested in reducing her dose to once a day.  Lab Results  Component Value Date   CREATININE 0.76 07/12/2019   BUN 8 07/12/2019   NA 138 07/12/2019   K 4.4 07/12/2019   CL 99 07/12/2019   CO2 24 07/12/2019   Lab Results  Component Value Date   CHOL 191 07/12/2019   HDL 42 07/12/2019   LDLCALC 134 (H) 07/12/2019   TRIG 81 07/12/2019   CHOLHDL 4.5 (H) 07/12/2019   Lab Results  Component Value Date   TSH 2.090 07/12/2019   Lab Results  Component Value Date   HGBA1C 6.1 (H) 07/12/2019     Review of Systems  Constitutional: Negative for chills, fatigue and fever.  HENT: Positive for ear pain, postnasal drip and sinus pressure. Negative for ear  discharge, hearing loss, sore throat and trouble swallowing.   Respiratory: Negative for chest tightness and shortness of breath.   Cardiovascular: Negative for chest pain, palpitations and leg swelling.  Gastrointestinal: Negative for abdominal pain, constipation, nausea and vomiting.  Genitourinary: Positive for flank pain and urgency. Negative for dysuria and hematuria.  Neurological: Negative for dizziness, light-headedness and headaches.    Patient Active Problem List   Diagnosis Date Noted  . Neck muscle spasm 11/08/2018  . Tendonitis 07/09/2018  . Plantar fasciitis of left foot 07/09/2018  . PCOS (polycystic ovarian syndrome) 05/28/2018  . Gastroesophageal reflux disease without esophagitis 05/28/2018  . Abdominal pain, RUQ 05/28/2018  . Breast mass, right 07/19/2016  . Prediabetes 06/17/2015  . Abnormal uterine bleeding 06/13/2015    No Known Allergies  Past Surgical History:  Procedure Laterality Date  . CESAREAN SECTION  2000  . LEEP  1996    Social History   Tobacco Use  . Smoking status: Never Smoker  . Smokeless tobacco: Never Used  Substance Use Topics  . Alcohol use: Yes    Comment: ocassional  . Drug use: No     Medication list has been reviewed and updated.  Current Meds  Medication Sig  . dicyclomine (BENTYL) 10 MG capsule TAKE 1 CAPSULE (10 MG TOTAL) BY MOUTH 3 (THREE) TIMES DAILY BEFORE MEALS.  Marland Kitchen levonorgestrel (MIRENA)  20 MCG/24HR IUD by Intrauterine route.  . meloxicam (MOBIC) 15 MG tablet TAKE 1 TABLET BY MOUTH EVERY DAY  . metFORMIN (GLUCOPHAGE) 500 MG tablet Take 500 mg by mouth daily with breakfast.   . metroNIDAZOLE (METROGEL VAGINAL) 0.75 % vaginal gel Place 1 Applicatorful vaginally 2 (two) times daily.  Marland Kitchen omeprazole (PRILOSEC) 20 MG capsule TAKE 1 CAPSULE (20 MG TOTAL) BY MOUTH 2 (TWO) TIMES DAILY BEFORE A MEAL.  Marland Kitchen spironolactone (ALDACTONE) 50 MG tablet TAKE 1 TABLET BY MOUTH TWICE A DAY    PHQ 2/9 Scores 12/30/2019 08/01/2019  07/12/2019 01/28/2019  PHQ - 2 Score 2 0 0 2  PHQ- 9 Score 8 - - 6    BP Readings from Last 3 Encounters:  12/30/19 118/78  08/01/19 128/72  07/12/19 117/77    Physical Exam Vitals and nursing note reviewed.  Constitutional:      Appearance: Normal appearance. She is well-developed.  HENT:     Right Ear: Hearing, ear canal and external ear normal. No middle ear effusion. Tympanic membrane is retracted. Tympanic membrane is not erythematous.     Left Ear: Hearing, ear canal and external ear normal.  No middle ear effusion. Tympanic membrane is retracted. Tympanic membrane is not erythematous.     Nose:     Right Sinus: Maxillary sinus tenderness present. No frontal sinus tenderness.     Left Sinus: Maxillary sinus tenderness present. No frontal sinus tenderness.  Cardiovascular:     Rate and Rhythm: Normal rate and regular rhythm.     Heart sounds: Normal heart sounds.  Pulmonary:     Effort: Pulmonary effort is normal. No respiratory distress.     Breath sounds: Normal breath sounds.  Abdominal:     General: Bowel sounds are normal.     Palpations: Abdomen is soft.     Tenderness: There is no abdominal tenderness. There is no right CVA tenderness, left CVA tenderness, guarding or rebound.  Lymphadenopathy:     Cervical: No cervical adenopathy.  Neurological:     Mental Status: She is alert.     Wt Readings from Last 3 Encounters:  12/30/19 145 lb (65.8 kg)  08/01/19 151 lb (68.5 kg)  07/12/19 150 lb (68 kg)    BP 118/78   Pulse 95   Temp (!) 97 F (36.1 C) (Temporal)   Ht 5\' 2"  (1.575 m)   Wt 145 lb (65.8 kg)   SpO2 99%   BMI 26.52 kg/m   Assessment and Plan: 1. Acute cystitis without hematuria Increase fluids - amoxicillin-clavulanate (AUGMENTIN) 875-125 MG tablet; Take 1 tablet by mouth 2 (two) times daily for 10 days.  Dispense: 20 tablet; Refill: 0 - POC urinalysis w microscopic (non auto)  2. Subacute maxillary sinusitis With ear congestion Augmentin  will address any deep sinus infection Use Flonase or Nasonex daily otc  3. PCOS (polycystic ovarian syndrome) May decrease the spironolactone to once a day.   Partially dictated using Editor, commissioning. Any errors are unintentional.  Halina Maidens, MD [facility]  12/30/2019

## 2019-12-30 NOTE — Patient Instructions (Signed)
Use Flonase or Nasonex spray to help with ear congestion

## 2020-01-10 ENCOUNTER — Ambulatory Visit: Payer: BC Managed Care – PPO | Admitting: Internal Medicine

## 2020-01-10 ENCOUNTER — Other Ambulatory Visit: Payer: Self-pay

## 2020-01-10 ENCOUNTER — Encounter: Payer: Self-pay | Admitting: Internal Medicine

## 2020-01-10 VITALS — BP 110/68 | HR 86 | Temp 97.0°F | Ht 62.0 in | Wt 144.0 lb

## 2020-01-10 DIAGNOSIS — E282 Polycystic ovarian syndrome: Secondary | ICD-10-CM | POA: Diagnosis not present

## 2020-01-10 DIAGNOSIS — R7303 Prediabetes: Secondary | ICD-10-CM | POA: Diagnosis not present

## 2020-01-10 DIAGNOSIS — R42 Dizziness and giddiness: Secondary | ICD-10-CM | POA: Diagnosis not present

## 2020-01-10 NOTE — Progress Notes (Signed)
Date:  01/10/2020   Name:  Meagan Gordon   DOB:  [birth year]   MRN:  [id number]   Chief Complaint: Diabetes (Follow up.)  Diabetes She presents for her follow-up diabetic visit. Diabetes type: pre-diabetes. Her disease course has been stable. Hypoglycemia symptoms include dizziness. Pertinent negatives for hypoglycemia include no headaches, nervousness/anxiousness or tremors. Pertinent negatives for diabetes include no chest pain and no weakness. Symptoms are stable. Current diabetic treatment includes diet. Her weight is stable.  Dizziness This is a new problem. The current episode started in the past 7 days. The problem occurs 2 to 4 times per day. The problem has been gradually improving. Associated symptoms include nausea and vertigo. Pertinent negatives include no chest pain, chills, coughing, fever, headaches, swollen glands, vomiting or weakness. She has tried nothing for the symptoms.    Lab Results  Component Value Date   CREATININE 0.76 07/12/2019   BUN 8 07/12/2019   NA 138 07/12/2019   K 4.4 07/12/2019   CL 99 07/12/2019   CO2 24 07/12/2019   Lab Results  Component Value Date   CHOL 191 07/12/2019   HDL 42 07/12/2019   LDLCALC 134 (H) 07/12/2019   TRIG 81 07/12/2019   CHOLHDL 4.5 (H) 07/12/2019   Lab Results  Component Value Date   TSH 2.090 07/12/2019   Lab Results  Component Value Date   HGBA1C 6.1 (H) 07/12/2019     Review of Systems  Constitutional: Negative for chills and fever.  Respiratory: Negative for cough, chest tightness, shortness of breath and wheezing.   Cardiovascular: Negative for chest pain and leg swelling.  Gastrointestinal: Positive for nausea. Negative for vomiting.  Neurological: Positive for dizziness, vertigo and light-headedness. Negative for tremors, syncope, weakness and headaches.  Psychiatric/Behavioral: Negative for dysphoric mood and sleep disturbance. The patient is not nervous/anxious.     Patient Active Problem List   Diagnosis Date Noted  . Neck muscle spasm 11/08/2018  . Tendonitis 07/09/2018  . Plantar fasciitis of left foot 07/09/2018  . PCOS (polycystic ovarian syndrome) 05/28/2018  . Gastroesophageal reflux disease without esophagitis 05/28/2018  . Abdominal pain, RUQ 05/28/2018  . Breast mass, right 07/19/2016  . Prediabetes 06/17/2015  . Abnormal uterine bleeding 06/13/2015    No Known Allergies  Past Surgical History:  Procedure Laterality Date  . CESAREAN SECTION  2000  . LEEP  1996    Social History   Tobacco Use  . Smoking status: Never Smoker  . Smokeless tobacco: Never Used  Substance Use Topics  . Alcohol use: Yes    Comment: ocassional  . Drug use: No     Medication list has been reviewed and updated.  Current Meds  Medication Sig  . dicyclomine (BENTYL) 10 MG capsule TAKE 1 CAPSULE (10 MG TOTAL) BY MOUTH 3 (THREE) TIMES DAILY BEFORE MEALS.  Marland Kitchen levonorgestrel (MIRENA) 20 MCG/24HR IUD by Intrauterine route.  . meloxicam (MOBIC) 15 MG tablet TAKE 1 TABLET BY MOUTH EVERY DAY  . metFORMIN (GLUCOPHAGE) 500 MG tablet Take 500 mg by mouth daily with breakfast.   . metroNIDAZOLE (METROGEL VAGINAL) 0.75 % vaginal gel Place 1 Applicatorful vaginally 2 (two) times daily.  Marland Kitchen omeprazole (PRILOSEC) 20 MG capsule TAKE 1 CAPSULE (20 MG TOTAL) BY MOUTH 2 (TWO) TIMES DAILY BEFORE A MEAL.  Marland Kitchen spironolactone (ALDACTONE) 50 MG tablet TAKE 1 TABLET BY MOUTH TWICE A DAY (Patient taking differently: 50 mg daily. )    PHQ 2/9 Scores 01/10/2020 12/30/2019 08/01/2019 07/12/2019  PHQ -  2 Score 0 2 0 0  PHQ- 9 Score 5 8 - -    BP Readings from Last 3 Encounters:  01/10/20 110/68  12/30/19 118/78  08/01/19 128/72    Physical Exam Vitals and nursing note reviewed.  Constitutional:      General: She is not in acute distress.    Appearance: Normal appearance. She is well-developed.  HENT:     Head: Normocephalic and atraumatic.     Right Ear: Hearing, tympanic membrane and ear canal normal.      Left Ear: Hearing, tympanic membrane and ear canal normal.     Nose:     Right Sinus: No maxillary sinus tenderness or frontal sinus tenderness.     Left Sinus: No maxillary sinus tenderness or frontal sinus tenderness.  Eyes:     Extraocular Movements:     Right eye: Nystagmus present.     Left eye: Nystagmus present.     Conjunctiva/sclera: Conjunctivae normal.  Cardiovascular:     Rate and Rhythm: Normal rate and regular rhythm.     Pulses: Normal pulses.  Pulmonary:     Effort: Pulmonary effort is normal. No respiratory distress.     Breath sounds: No wheezing or rhonchi.  Musculoskeletal:     Cervical back: Normal range of motion.     Right lower leg: No edema.     Left lower leg: No edema.  Lymphadenopathy:     Cervical: No cervical adenopathy.  Skin:    General: Skin is warm and dry.     Findings: No rash.  Neurological:     Mental Status: She is alert and oriented to person, place, and time.  Psychiatric:        Behavior: Behavior normal.        Thought Content: Thought content normal.     Wt Readings from Last 3 Encounters:  01/10/20 144 lb (65.3 kg)  12/30/19 145 lb (65.8 kg)  08/01/19 151 lb (68.5 kg)    BP 110/68   Pulse 86   Temp (!) 97 F (36.1 C) (Temporal)   Ht 5\' 2"  (1.575 m)   Wt 144 lb (65.3 kg)   SpO2 98%   BMI 26.34 kg/m   Assessment and Plan: 1. Prediabetes Continue healthy diet, regular exercise She is only taking metformin intermittently - would benefit from taking it daily - Hemoglobin H8N - Basic metabolic panel  2. Vertigo New, mild symptoms expect to be self-limited Can take over the counter Dramamine if needed  3. PCOS (polycystic ovarian syndrome) Recommend taking metformin daily   Partially dictated using Editor, commissioning. Any errors are unintentional.  Halina Maidens, MD [facility]  01/10/2020

## 2020-01-11 LAB — BASIC METABOLIC PANEL
BUN/Creatinine Ratio: 11 (ref 9–23)
BUN: 9 mg/dL (ref 6–24)
CO2: 22 mmol/L (ref 20–29)
Calcium: 9.6 mg/dL (ref 8.7–10.2)
Chloride: 101 mmol/L (ref 96–106)
Creatinine, Ser: 0.82 mg/dL (ref 0.57–1.00)
GFR calc Af Amer: 96 mL/min/{1.73_m2} (ref >59)
GFR calc non Af Amer: 83 mL/min/{1.73_m2} (ref >59)
Glucose: 136 mg/dL — ABNORMAL HIGH (ref 65–99)
Potassium: 4.5 mmol/L (ref 3.5–5.2)
Sodium: 139 mmol/L (ref 134–144)

## 2020-01-11 LAB — HEMOGLOBIN A1C
Est. average glucose Bld gHb Est-mCnc: 137 mg/dL
Hgb A1c MFr Bld: 6.4 % — ABNORMAL HIGH (ref 4.8–5.6)

## 2020-01-18 ENCOUNTER — Other Ambulatory Visit: Payer: Self-pay | Admitting: Internal Medicine

## 2020-01-18 DIAGNOSIS — R1011 Right upper quadrant pain: Secondary | ICD-10-CM

## 2020-04-01 ENCOUNTER — Other Ambulatory Visit: Payer: Self-pay | Admitting: Internal Medicine

## 2020-04-01 DIAGNOSIS — K219 Gastro-esophageal reflux disease without esophagitis: Secondary | ICD-10-CM

## 2020-04-01 NOTE — Telephone Encounter (Signed)
Requested Prescriptions  Pending Prescriptions Disp Refills  . omeprazole (PRILOSEC) 20 MG capsule [Pharmacy Med Name: OMEPRAZOLE DR 20 MG CAPSULE] 180 capsule 1    Sig: TAKE 1 CAPSULE (20 MG TOTAL) BY MOUTH 2 (TWO) TIMES DAILY BEFORE A MEAL.     Gastroenterology: Proton Pump Inhibitors Passed - 04/01/2020  9:45 AM      Passed - Valid encounter within last 12 months    Recent Outpatient Visits          2 months ago Prediabetes   [facility] Reubin Milan, MD   3 months ago Acute cystitis without hematuria   [facility] Reubin Milan, MD   8 months ago Urgency of urination   [facility] Reubin Milan, MD   8 months ago Annual physical exam   [facility] Reubin Milan, MD   1 year ago Abdominal pain, RUQ   [facility] Reubin Milan, MD      Future Appointments            In 3 months Judithann Graves Nyoka Cowden, MD [facility]

## 2020-04-17 ENCOUNTER — Encounter: Payer: Self-pay | Admitting: Internal Medicine

## 2020-04-17 ENCOUNTER — Other Ambulatory Visit: Payer: Self-pay | Admitting: Internal Medicine

## 2020-04-17 ENCOUNTER — Other Ambulatory Visit: Payer: Self-pay

## 2020-04-17 ENCOUNTER — Ambulatory Visit: Payer: BC Managed Care – PPO | Admitting: Internal Medicine

## 2020-04-17 VITALS — BP 124/70 | HR 88 | Ht 62.0 in | Wt 144.0 lb

## 2020-04-17 DIAGNOSIS — N3001 Acute cystitis with hematuria: Secondary | ICD-10-CM | POA: Diagnosis not present

## 2020-04-17 LAB — POC URINALYSIS WITH MICROSCOPIC (NON AUTO)MANUAL RESULT
Bilirubin, UA: NEGATIVE
Crystals: 0
Epithelial cells, urine per micros: 0
Glucose, UA: NEGATIVE
Ketones, UA: NEGATIVE
Mucus, UA: 0
Nitrite, UA: NEGATIVE
Protein, UA: NEGATIVE
RBC: 2 M/uL — AB (ref 4.04–5.48)
Spec Grav, UA: 1.01 (ref 1.010–1.025)
Urobilinogen, UA: 0.2 E.U./dL
WBC Casts, UA: 15
pH, UA: 5 (ref 5.0–8.0)

## 2020-04-17 MED ORDER — CIPROFLOXACIN HCL 250 MG PO TABS: 250.0000 mg | ORAL_TABLET | Freq: Two times a day (BID) | ORAL | 0 refills | Status: AC

## 2020-04-17 NOTE — Progress Notes (Signed)
Date:  04/17/2020   Name:  Meagan Gordon   DOB:  [birth year]   MRN:  [id number]   Chief Complaint: Urinary Tract Infection (Urinary frequency.)  Urinary Tract Infection  This is a new problem. The current episode started yesterday. The problem occurs every urination. The problem has been gradually worsening. The quality of the pain is described as burning. The pain is mild. There has been no fever. Associated symptoms include frequency, hesitancy and urgency. Pertinent negatives include no chills, discharge, hematuria, nausea or vomiting.    Lab Results  Component Value Date   CREATININE 0.82 01/10/2020   BUN 9 01/10/2020   NA 139 01/10/2020   K 4.5 01/10/2020   CL 101 01/10/2020   CO2 22 01/10/2020   Lab Results  Component Value Date   CHOL 191 07/12/2019   HDL 42 07/12/2019   LDLCALC 134 (H) 07/12/2019   TRIG 81 07/12/2019   CHOLHDL 4.5 (H) 07/12/2019   Lab Results  Component Value Date   TSH 2.090 07/12/2019   Lab Results  Component Value Date   HGBA1C 6.4 (H) 01/10/2020   Lab Results  Component Value Date   WBC 7.5 07/12/2019   HGB 14.0 07/12/2019   HCT 42.2 07/12/2019   MCV 92 07/12/2019   PLT 348 07/12/2019   Lab Results  Component Value Date   ALT 11 07/12/2019   AST 15 07/12/2019   ALKPHOS 129 (H) 07/12/2019   BILITOT 0.3 07/12/2019     Review of Systems  Constitutional: Negative for chills.  Gastrointestinal: Negative for nausea and vomiting.  Genitourinary: Positive for frequency, hesitancy and urgency. Negative for hematuria.    Patient Active Problem List   Diagnosis Date Noted  . Neck muscle spasm 11/08/2018  . Tendonitis 07/09/2018  . Plantar fasciitis of left foot 07/09/2018  . PCOS (polycystic ovarian syndrome) 05/28/2018  . Gastroesophageal reflux disease without esophagitis 05/28/2018  . Abdominal pain, RUQ 05/28/2018  . Breast mass, right 07/19/2016  . Prediabetes 06/17/2015  . Abnormal uterine bleeding 06/13/2015    No Known  Allergies  Past Surgical History:  Procedure Laterality Date  . CESAREAN SECTION  2000  . LEEP  1996    Social History   Tobacco Use  . Smoking status: Never Smoker  . Smokeless tobacco: Never Used  Substance Use Topics  . Alcohol use: Yes    Comment: ocassional  . Drug use: No     Medication list has been reviewed and updated.  Current Meds  Medication Sig  . dicyclomine (BENTYL) 10 MG capsule TAKE 1 CAPSULE (10 MG TOTAL) BY MOUTH 3 (THREE) TIMES DAILY BEFORE MEALS.  Marland Kitchen levonorgestrel (MIRENA) 20 MCG/24HR IUD by Intrauterine route.  . meloxicam (MOBIC) 15 MG tablet TAKE 1 TABLET BY MOUTH EVERY DAY  . metFORMIN (GLUCOPHAGE) 500 MG tablet Take 500 mg by mouth daily with breakfast.   . metroNIDAZOLE (METROGEL VAGINAL) 0.75 % vaginal gel Place 1 Applicatorful vaginally 2 (two) times daily.  Marland Kitchen omeprazole (PRILOSEC) 20 MG capsule TAKE 1 CAPSULE (20 MG TOTAL) BY MOUTH 2 (TWO) TIMES DAILY BEFORE A MEAL.  Marland Kitchen spironolactone (ALDACTONE) 50 MG tablet TAKE 1 TABLET BY MOUTH TWICE A DAY (Patient taking differently: 50 mg daily. )    PHQ 2/9 Scores 04/17/2020 01/10/2020 12/30/2019 08/01/2019  PHQ - 2 Score 0 0 2 0  PHQ- 9 Score 0 5 8 -    BP Readings from Last 3 Encounters:  04/17/20 124/70  01/10/20 110/68  12/30/19  118/78    Physical Exam Vitals and nursing note reviewed.  Constitutional:      Appearance: Normal appearance. She is well-developed.  Cardiovascular:     Rate and Rhythm: Normal rate and regular rhythm.     Heart sounds: Normal heart sounds.  Pulmonary:     Effort: Pulmonary effort is normal. No respiratory distress.     Breath sounds: Normal breath sounds.  Abdominal:     General: Bowel sounds are normal.     Palpations: Abdomen is soft.     Tenderness: There is abdominal tenderness in the suprapubic area. There is no guarding or rebound.  Musculoskeletal:     Right lower leg: No edema.     Left lower leg: No edema.  Skin:    General: Skin is warm.    Neurological:     General: No focal deficit present.     Mental Status: She is alert and oriented to person, place, and time.     Wt Readings from Last 3 Encounters:  04/17/20 144 lb (65.3 kg)  01/10/20 144 lb (65.3 kg)  12/30/19 145 lb (65.8 kg)    BP 124/70 (BP Location: Right Arm, Patient Position: Sitting, Cuff Size: Normal)   Pulse 88   Ht 5\' 2"  (1.575 m)   Wt 144 lb (65.3 kg)   SpO2 97%   BMI 26.34 kg/m   Assessment and Plan: 1. Acute cystitis with hematuria Continue to push fluids Take all antibiotics - can crush in applesauce if needed - POC urinalysis w microscopic (non auto) - Urine Culture - ciprofloxacin (CIPRO) 250 MG tablet; Take 1 tablet (250 mg total) by mouth 2 (two) times daily for 7 days.  Dispense: 14 tablet; Refill: 0   Partially dictated using . Any errors are unintentional.  Animal nutritionist, MD [facility]  04/17/2020

## 2020-04-19 LAB — URINE CULTURE

## 2020-06-21 ENCOUNTER — Encounter: Payer: Self-pay | Admitting: Internal Medicine

## 2020-06-21 ENCOUNTER — Other Ambulatory Visit: Payer: Self-pay | Admitting: Internal Medicine

## 2020-06-21 DIAGNOSIS — F418 Other specified anxiety disorders: Secondary | ICD-10-CM

## 2020-06-21 MED ORDER — ALPRAZOLAM 0.25 MG PO TABS: 0.2500 mg | ORAL_TABLET | Freq: Every day | ORAL | 0 refills | Status: DC | PRN

## 2020-07-03 ENCOUNTER — Other Ambulatory Visit: Payer: Self-pay | Admitting: Internal Medicine

## 2020-07-03 DIAGNOSIS — Z1231 Encounter for screening mammogram for malignant neoplasm of breast: Secondary | ICD-10-CM

## 2020-07-13 ENCOUNTER — Encounter: Payer: BC Managed Care – PPO | Admitting: Internal Medicine

## 2020-07-25 ENCOUNTER — Encounter: Payer: Self-pay | Admitting: Internal Medicine

## 2020-07-31 ENCOUNTER — Other Ambulatory Visit: Payer: Self-pay

## 2020-07-31 ENCOUNTER — Other Ambulatory Visit
Admission: RE | Admit: 2020-07-31 | Discharge: 2020-07-31 | Disposition: A | Discharge status: Home / Self Care | Payer: BC Managed Care – PPO | Attending: Internal Medicine | Admitting: Internal Medicine

## 2020-07-31 ENCOUNTER — Ambulatory Visit: Payer: BC Managed Care – PPO | Admitting: Internal Medicine

## 2020-07-31 ENCOUNTER — Encounter: Payer: Self-pay | Admitting: Internal Medicine

## 2020-07-31 VITALS — BP 122/78 | HR 82 | Temp 98.1°F | Ht 62.0 in | Wt 153.0 lb

## 2020-07-31 DIAGNOSIS — R7303 Prediabetes: Secondary | ICD-10-CM

## 2020-07-31 DIAGNOSIS — Z1159 Encounter for screening for other viral diseases: Secondary | ICD-10-CM | POA: Diagnosis not present

## 2020-07-31 DIAGNOSIS — Z1231 Encounter for screening mammogram for malignant neoplasm of breast: Secondary | ICD-10-CM

## 2020-07-31 DIAGNOSIS — Z1211 Encounter for screening for malignant neoplasm of colon: Secondary | ICD-10-CM | POA: Diagnosis not present

## 2020-07-31 DIAGNOSIS — Z0000 Encounter for general adult medical examination without abnormal findings: Secondary | ICD-10-CM | POA: Insufficient documentation

## 2020-07-31 LAB — CBC WITH DIFFERENTIAL/PLATELET
Abs Immature Granulocytes: 0.04 10*3/uL (ref 0.00–0.07)
Basophils Absolute: 0 10*3/uL (ref 0.0–0.1)
Basophils Relative: 0 %
Eosinophils Absolute: 0.1 10*3/uL (ref 0.0–0.5)
Eosinophils Relative: 1 %
HCT: 39.8 % (ref 36.0–46.0)
Hemoglobin: 13.7 g/dL (ref 12.0–15.0)
Immature Granulocytes: 1 %
Lymphocytes Relative: 22 %
Lymphs Abs: 1.6 10*3/uL (ref 0.7–4.0)
MCH: 31.1 pg (ref 26.0–34.0)
MCHC: 34.4 g/dL (ref 30.0–36.0)
MCV: 90.2 fL (ref 80.0–100.0)
Monocytes Absolute: 0.6 10*3/uL (ref 0.1–1.0)
Monocytes Relative: 8 %
Neutro Abs: 4.8 10*3/uL (ref 1.7–7.7)
Neutrophils Relative %: 68 %
Platelets: 315 10*3/uL (ref 150–400)
RBC: 4.41 MIL/uL (ref 3.87–5.11)
RDW: 12.2 % (ref 11.5–15.5)
WBC: 7.1 10*3/uL (ref 4.0–10.5)
nRBC: 0 % (ref 0.0–0.2)

## 2020-07-31 LAB — COMPREHENSIVE METABOLIC PANEL
ALT: 15 U/L (ref 0–44)
AST: 14 U/L — ABNORMAL LOW (ref 15–41)
Albumin: 4.2 g/dL (ref 3.5–5.0)
Alkaline Phosphatase: 97 U/L (ref 38–126)
Anion gap: 7 (ref 5–15)
BUN: 10 mg/dL (ref 6–20)
CO2: 29 mmol/L (ref 22–32)
Calcium: 9 mg/dL (ref 8.9–10.3)
Chloride: 102 mmol/L (ref 98–111)
Creatinine, Ser: 0.72 mg/dL (ref 0.44–1.00)
GFR calc Af Amer: >60 mL/min (ref >60)
GFR calc non Af Amer: >60 mL/min (ref >60)
Glucose, Bld: 132 mg/dL — ABNORMAL HIGH (ref 70–99)
Potassium: 4.2 mmol/L (ref 3.5–5.1)
Sodium: 138 mmol/L (ref 135–145)
Total Bilirubin: 0.6 mg/dL (ref 0.3–1.2)
Total Protein: 7.6 g/dL (ref 6.5–8.1)

## 2020-07-31 LAB — POCT URINALYSIS DIPSTICK
Bilirubin, UA: NEGATIVE
Blood, UA: NEGATIVE
Glucose, UA: NEGATIVE
Ketones, UA: NEGATIVE
Leukocytes, UA: NEGATIVE
Nitrite, UA: NEGATIVE
Protein, UA: NEGATIVE
Spec Grav, UA: 1.01 (ref 1.010–1.025)
Urobilinogen, UA: 0.2 E.U./dL
pH, UA: 6.5 (ref 5.0–8.0)

## 2020-07-31 LAB — LIPID PANEL
Cholesterol: 195 mg/dL (ref 0–200)
HDL: 43 mg/dL (ref >40)
LDL Cholesterol: 133 mg/dL — ABNORMAL HIGH (ref 0–99)
Total CHOL/HDL Ratio: 4.5 RATIO
Triglycerides: 93 mg/dL (ref <150)
VLDL: 19 mg/dL (ref 0–40)

## 2020-07-31 LAB — HEMOGLOBIN A1C
Hgb A1c MFr Bld: 6.4 % — ABNORMAL HIGH (ref 4.8–5.6)
Mean Plasma Glucose: 136.98 mg/dL

## 2020-07-31 LAB — HEPATITIS C ANTIBODY: HCV Ab: NONREACTIVE

## 2020-07-31 LAB — TSH: TSH: 1.645 u[IU]/mL (ref 0.350–4.500)

## 2020-07-31 NOTE — Progress Notes (Signed)
Date:  07/31/2020   Name:  Meagan Gordon   DOB:  [birth year]   MRN:  [id number]   Chief Complaint: Annual Exam (Breast Exam. No pap.)  Meagan Gordon is a 54 y.o. female who presents today for her Complete Annual Exam. She feels well. She reports exercising - none. She reports she is sleeping well. Breast complaints - none.  She has been a bit stressed out over the past few months handling family estate issues.  Mammogram: 10/20 - scheduled 08/17/20 Pap smear: 01/2017 neg with cotesting Colonoscopy: none; FIT 10/20  Immunization History  Administered Date(s) Administered  . Influenza,inj,Quad PF,6+ Mos 09/28/2018  . Influenza-Unspecified 09/30/2019  . Tdap 05/28/2018    Diabetes She presents for her follow-up diabetic visit. Diabetes type: prediabetes/PCOS. Pertinent negatives for hypoglycemia include no dizziness, headaches, nervousness/anxiousness or tremors. Pertinent negatives for diabetes include no chest pain, no fatigue, no polydipsia and no polyuria.  Gastroesophageal Reflux She complains of heartburn. She reports no abdominal pain, no chest pain, no coughing or no wheezing. This is a recurrent problem. The problem occurs occasionally. Pertinent negatives include no fatigue. She has tried a PPI for the symptoms.    Lab Results  Component Value Date   CREATININE 0.82 01/10/2020   BUN 9 01/10/2020   NA 139 01/10/2020   K 4.5 01/10/2020   CL 101 01/10/2020   CO2 22 01/10/2020   Lab Results  Component Value Date   CHOL 191 07/12/2019   HDL 42 07/12/2019   LDLCALC 134 (H) 07/12/2019   TRIG 81 07/12/2019   CHOLHDL 4.5 (H) 07/12/2019   Lab Results  Component Value Date   TSH 2.090 07/12/2019   Lab Results  Component Value Date   HGBA1C 6.4 (H) 01/10/2020   Lab Results  Component Value Date   WBC 7.5 07/12/2019   HGB 14.0 07/12/2019   HCT 42.2 07/12/2019   MCV 92 07/12/2019   PLT 348 07/12/2019   Lab Results  Component Value Date   ALT 11 07/12/2019   AST  15 07/12/2019   ALKPHOS 129 (H) 07/12/2019   BILITOT 0.3 07/12/2019     Review of Systems  Constitutional: Negative for chills, fatigue and fever.  HENT: Negative for congestion, hearing loss, tinnitus, trouble swallowing and voice change.   Eyes: Negative for visual disturbance.  Respiratory: Negative for cough, chest tightness, shortness of breath and wheezing.   Cardiovascular: Negative for chest pain, palpitations and leg swelling.  Gastrointestinal: Positive for heartburn. Negative for abdominal pain, constipation, diarrhea and vomiting.  Endocrine: Negative for polydipsia and polyuria.  Genitourinary: Negative for dysuria, frequency, genital sores, vaginal bleeding and vaginal discharge.  Musculoskeletal: Negative for arthralgias, gait problem and joint swelling.  Skin: Negative for color change and rash.  Neurological: Negative for dizziness, tremors, light-headedness and headaches.  Hematological: Negative for adenopathy. Does not bruise/bleed easily.  Psychiatric/Behavioral: Negative for dysphoric mood and sleep disturbance. The patient is not nervous/anxious.     Patient Active Problem List   Diagnosis Date Noted  . Neck muscle spasm 11/08/2018  . Tendonitis 07/09/2018  . Plantar fasciitis of left foot 07/09/2018  . PCOS (polycystic ovarian syndrome) 05/28/2018  . Gastroesophageal reflux disease without esophagitis 05/28/2018  . Abdominal pain, RUQ 05/28/2018  . Breast mass, right 07/19/2016  . Prediabetes 06/17/2015    No Known Allergies  Past Surgical History:  Procedure Laterality Date  . CESAREAN SECTION  2000  . LEEP  1996    Social History  Tobacco Use  . Smoking status: Never Smoker  . Smokeless tobacco: Never Used  Vaping Use  . Vaping Use: Never used  Substance Use Topics  . Alcohol use: Yes    Comment: ocassional  . Drug use: No     Medication list has been reviewed and updated.  Current Meds  Medication Sig  . ALPRAZolam (XANAX) 0.25  MG tablet Take 1 tablet (0.25 mg total) by mouth daily as needed for anxiety (related to dental procedures).  Marland Kitchen dicyclomine (BENTYL) 10 MG capsule TAKE 1 CAPSULE (10 MG TOTAL) BY MOUTH 3 (THREE) TIMES DAILY BEFORE MEALS. (Patient taking differently: Take 10 mg by mouth in the morning and at bedtime. )  . levonorgestrel (MIRENA) 20 MCG/24HR IUD by Intrauterine route.  . metFORMIN (GLUCOPHAGE) 500 MG tablet Take 500 mg by mouth daily with breakfast.   . metroNIDAZOLE (METROGEL VAGINAL) 0.75 % vaginal gel Place 1 Applicatorful vaginally 2 (two) times daily.  Marland Kitchen omeprazole (PRILOSEC) 20 MG capsule TAKE 1 CAPSULE (20 MG TOTAL) BY MOUTH 2 (TWO) TIMES DAILY BEFORE A MEAL.  Marland Kitchen spironolactone (ALDACTONE) 50 MG tablet TAKE 1 TABLET BY MOUTH TWICE A DAY (Patient taking differently: 50 mg daily. )    PHQ 2/9 Scores 07/31/2020 04/17/2020 01/10/2020 12/30/2019  PHQ - 2 Score 0 0 0 2  PHQ- 9 Score 0 0 5 8    GAD 7 : Generalized Anxiety Score 07/31/2020 04/17/2020  Nervous, Anxious, on Edge 0 0  Control/stop worrying 0 0  Worry too much - different things 0 0  Trouble relaxing 0 0  Restless 0 0  Easily annoyed or irritable 0 0  Afraid - awful might happen 0 0  Total GAD 7 Score 0 0  Anxiety Difficulty Not difficult at all Not difficult at all    BP Readings from Last 3 Encounters:  07/31/20 122/78  04/17/20 124/70  01/10/20 110/68    Physical Exam Vitals and nursing note reviewed.  Constitutional:      General: She is not in acute distress.    Appearance: She is well-developed.  HENT:     Head: Normocephalic and atraumatic.     Right Ear: Tympanic membrane and ear canal normal.     Left Ear: Tympanic membrane and ear canal normal.     Nose:     Right Sinus: No maxillary sinus tenderness.     Left Sinus: No maxillary sinus tenderness.  Eyes:     General: No scleral icterus.       Right eye: No discharge.        Left eye: No discharge.     Conjunctiva/sclera: Conjunctivae normal.  Neck:      Thyroid: No thyromegaly.     Vascular: No carotid bruit.  Cardiovascular:     Rate and Rhythm: Normal rate and regular rhythm.     Pulses: Normal pulses.     Heart sounds: Normal heart sounds.  Pulmonary:     Effort: Pulmonary effort is normal. No respiratory distress.     Breath sounds: No wheezing.  Chest:     Breasts:        Right: No mass, nipple discharge, skin change or tenderness.        Left: No mass, nipple discharge, skin change or tenderness.  Abdominal:     General: Bowel sounds are normal.     Palpations: Abdomen is soft.     Tenderness: There is no abdominal tenderness.  Musculoskeletal:     Cervical back:  Normal range of motion. No erythema.     Right lower leg: No edema.     Left lower leg: No edema.  Lymphadenopathy:     Cervical: No cervical adenopathy.  Skin:    General: Skin is warm and dry.     Findings: No rash.  Neurological:     Mental Status: She is alert and oriented to person, place, and time.     Cranial Nerves: No cranial nerve deficit.     Sensory: No sensory deficit.     Deep Tendon Reflexes: Reflexes are normal and symmetric.  Psychiatric:        Attention and Perception: Attention normal.        Mood and Affect: Mood normal.     Wt Readings from Last 3 Encounters:  07/31/20 153 lb (69.4 kg)  04/17/20 144 lb (65.3 kg)  01/10/20 144 lb (65.3 kg)    BP 122/78   Pulse 82   Temp 98.1 F (36.7 C) (Oral)   Ht 5\' 2"  (1.575 m)   Wt 153 lb (69.4 kg)   SpO2 98%   BMI 27.98 kg/m   Assessment and Plan: 1. Annual physical exam Work on improved diet to lower BS Regular exercise recommended - CBC with Differential/Platelet - Lipid panel - TSH  2. Encounter for screening mammogram for breast cancer scheduled  3. Need for hepatitis C screening test - Hepatitis C antibody  4. Colon cancer screening - Fecal occult blood, imunochemical - POCT urinalysis dipstick  5. Prediabetes Will likely be higher A1C due to weight gain Schedule  eye exam Resume regular doses of metformin - will advise if more than once a day is needed - Comprehensive metabolic panel - Hemoglobin A1c   Partially dictated using . Any errors are unintentional.  Animal nutritionist, MD [facility]  07/31/2020

## 2020-08-13 ENCOUNTER — Encounter: Payer: Self-pay | Admitting: Internal Medicine

## 2020-08-16 DIAGNOSIS — Z1211 Encounter for screening for malignant neoplasm of colon: Secondary | ICD-10-CM | POA: Diagnosis not present

## 2020-08-17 ENCOUNTER — Other Ambulatory Visit: Payer: Self-pay

## 2020-08-17 ENCOUNTER — Ambulatory Visit
Admission: RE | Admit: 2020-08-17 | Discharge: 2020-08-17 | Disposition: A | Discharge status: Home / Self Care | Payer: BC Managed Care – PPO | Source: Ambulatory Visit | Attending: Internal Medicine | Admitting: Internal Medicine

## 2020-08-17 DIAGNOSIS — Z1231 Encounter for screening mammogram for malignant neoplasm of breast: Secondary | ICD-10-CM | POA: Diagnosis not present

## 2020-08-18 LAB — FECAL OCCULT BLOOD, IMMUNOCHEMICAL: Fecal Occult Bld: NEGATIVE

## 2020-08-18 LAB — SPECIMEN STATUS REPORT

## 2020-09-27 ENCOUNTER — Other Ambulatory Visit: Payer: Self-pay | Admitting: Internal Medicine

## 2020-09-27 DIAGNOSIS — K219 Gastro-esophageal reflux disease without esophagitis: Secondary | ICD-10-CM

## 2020-09-27 DIAGNOSIS — R1011 Right upper quadrant pain: Secondary | ICD-10-CM

## 2020-09-27 NOTE — Telephone Encounter (Signed)
Requested medications are due for refill today yes  Requested medications are on the active medication list yes  Last refill 8/22  Notes to clinic Pharmacy states that pt taking one in am and one in pm, please assess

## 2020-11-27 ENCOUNTER — Ambulatory Visit: Payer: BC Managed Care – PPO | Admitting: Internal Medicine

## 2020-12-18 ENCOUNTER — Other Ambulatory Visit: Payer: Self-pay

## 2020-12-18 ENCOUNTER — Ambulatory Visit: Payer: BC Managed Care – PPO | Admitting: Internal Medicine

## 2020-12-18 ENCOUNTER — Encounter: Payer: Self-pay | Admitting: Internal Medicine

## 2020-12-18 VITALS — BP 128/70 | HR 103 | Ht 62.0 in | Wt 155.0 lb

## 2020-12-18 DIAGNOSIS — Z975 Presence of (intrauterine) contraceptive device: Secondary | ICD-10-CM

## 2020-12-18 DIAGNOSIS — M5441 Lumbago with sciatica, right side: Secondary | ICD-10-CM

## 2020-12-18 MED ORDER — CYCLOBENZAPRINE HCL 10 MG PO TABS: 10.0000 mg | ORAL_TABLET | Freq: Every day | ORAL | 1 refills | Status: DC

## 2020-12-18 MED ORDER — PREDNISONE 10 MG PO TABS: 10.0000 mg | ORAL_TABLET | ORAL | 0 refills | Status: AC

## 2020-12-18 NOTE — Progress Notes (Signed)
Date:  12/18/2020   Name:  Meagan Gordon   DOB:  07-16-1966   MRN:  [id number]   Chief Complaint: Leg Pain (Right leg pain. Off and on for 3 to 4 months. Can wake her up in the morning aching and sharp pains during the day.  Needs to know how she is supposed to be taking spironalactone? RX was sent in wrong to pharmacy. ) and Back Pain (X 2 weeks ago patient was coughing and patient felt something pop in her back. Back has been hurting since then. Taking tylenol PRN but not much relief. )  Leg Pain  There was no injury mechanism. The pain is present in the right leg. The quality of the pain is described as stabbing. The pain has been intermittent since onset. Pertinent negatives include no inability to bear weight, loss of motion, muscle weakness or numbness. Nothing aggravates the symptoms.  Back Pain This is a new problem. The current episode started 1 to 4 weeks ago. The problem occurs constantly. The problem is unchanged. The pain is present in the lumbar spine. The quality of the pain is described as burning, shooting and cramping. The pain is moderate. Associated symptoms include leg pain. Pertinent negatives include no chest pain, fever or numbness. She has tried ice, heat and bed rest for the symptoms. The treatment provided mild relief.    Lab Results  Component Value Date   CREATININE 0.72 07/31/2020   BUN 10 07/31/2020   NA 138 07/31/2020   K 4.2 07/31/2020   CL 102 07/31/2020   CO2 29 07/31/2020   Lab Results  Component Value Date   CHOL 195 07/31/2020   HDL 43 07/31/2020   LDLCALC 133 (H) 07/31/2020   TRIG 93 07/31/2020   CHOLHDL 4.5 07/31/2020   Lab Results  Component Value Date   TSH 1.645 07/31/2020   Lab Results  Component Value Date   HGBA1C 6.4 (H) 07/31/2020   Lab Results  Component Value Date   WBC 7.1 07/31/2020   HGB 13.7 07/31/2020   HCT 39.8 07/31/2020   MCV 90.2 07/31/2020   PLT 315 07/31/2020   Lab Results  Component Value Date   ALT 15  07/31/2020   AST 14 (L) 07/31/2020   ALKPHOS 97 07/31/2020   BILITOT 0.6 07/31/2020     Review of Systems  Constitutional: Negative for chills, fatigue and fever.  Respiratory: Negative for cough, chest tightness and shortness of breath.   Cardiovascular: Negative for chest pain, palpitations and leg swelling.  Musculoskeletal: Positive for back pain and myalgias. Negative for arthralgias.  Neurological: Negative for dizziness and numbness.    Patient Active Problem List   Diagnosis Date Noted  . Neck muscle spasm 11/08/2018  . Tendonitis 07/09/2018  . Plantar fasciitis of left foot 07/09/2018  . PCOS (polycystic ovarian syndrome) 05/28/2018  . Gastroesophageal reflux disease without esophagitis 05/28/2018  . Abdominal pain, RUQ 05/28/2018  . Breast mass, right 07/19/2016  . Prediabetes 06/17/2015    No Known Allergies  Past Surgical History:  Procedure Laterality Date  . CESAREAN SECTION  2000  . LEEP  1996    Social History   Tobacco Use  . Smoking status: Never Smoker  . Smokeless tobacco: Never Used  Vaping Use  . Vaping Use: Never used  Substance Use Topics  . Alcohol use: Yes    Comment: ocassional  . Drug use: No     Medication list has been reviewed and updated.  Current Meds  Medication Sig  . ALPRAZolam (XANAX) 0.25 MG tablet Take 1 tablet (0.25 mg total) by mouth daily as needed for anxiety (related to dental procedures).  Marland Kitchen dicyclomine (BENTYL) 10 MG capsule TAKE 1 CAPSULE (10 MG TOTAL) BY MOUTH 3 (THREE) TIMES DAILY BEFORE MEALS.  Marland Kitchen levonorgestrel (MIRENA) 20 MCG/24HR IUD by Intrauterine route.  . metFORMIN (GLUCOPHAGE) 500 MG tablet Take 500 mg by mouth daily with breakfast.   . metroNIDAZOLE (METROGEL VAGINAL) 0.75 % vaginal gel Place 1 Applicatorful vaginally 2 (two) times daily.  Marland Kitchen omeprazole (PRILOSEC) 20 MG capsule TAKE 1 CAPSULE (20 MG TOTAL) BY MOUTH 2 (TWO) TIMES DAILY BEFORE A MEAL.  Marland Kitchen spironolactone (ALDACTONE) 50 MG tablet TAKE 1  TABLET BY MOUTH TWICE A DAY (Patient taking differently: 50 mg daily.)    PHQ 2/9 Scores 12/18/2020 07/31/2020 04/17/2020 01/10/2020  PHQ - 2 Score 0 0 0 0  PHQ- 9 Score 0 0 0 5    GAD 7 : Generalized Anxiety Score 12/18/2020 07/31/2020 04/17/2020  Nervous, Anxious, on Edge 0 0 0  Control/stop worrying 0 0 0  Worry too much - different things 0 0 0  Trouble relaxing 0 0 0  Restless 0 0 0  Easily annoyed or irritable 0 0 0  Afraid - awful might happen 0 0 0  Total GAD 7 Score 0 0 0  Anxiety Difficulty Not difficult at all Not difficult at all Not difficult at all    BP Readings from Last 3 Encounters:  12/18/20 128/70  07/31/20 122/78  04/17/20 124/70    Physical Exam Vitals and nursing note reviewed.  Constitutional:      General: She is not in acute distress.    Appearance: She is well-developed. She is not ill-appearing.  HENT:     Head: Normocephalic and atraumatic.  Cardiovascular:     Rate and Rhythm: Normal rate and regular rhythm.     Pulses: Normal pulses.     Heart sounds: No murmur heard.   Pulmonary:     Effort: Pulmonary effort is normal. No respiratory distress.     Breath sounds: No wheezing or rhonchi.  Musculoskeletal:     Cervical back: Normal range of motion.     Lumbar back: Spasms, tenderness and bony tenderness present. Positive right straight leg raise test. Negative left straight leg raise test. No scoliosis.     Right lower leg: No edema.     Left lower leg: No edema.  Lymphadenopathy:     Cervical: No cervical adenopathy.  Skin:    General: Skin is warm and dry.     Findings: No rash.  Neurological:     Mental Status: She is alert and oriented to person, place, and time.     Sensory: Sensation is intact.     Motor: Motor function is intact.     Deep Tendon Reflexes:     Reflex Scores:      Patellar reflexes are 2+ on the right side and 2+ on the left side. Psychiatric:        Mood and Affect: Mood and affect normal.        Behavior: Behavior  normal.        Thought Content: Thought content normal.     Wt Readings from Last 3 Encounters:  12/18/20 155 lb (70.3 kg)  07/31/20 153 lb (69.4 kg)  04/17/20 144 lb (65.3 kg)    BP 128/70   Pulse (!) 103   Ht 5'  2" (1.575 m)   Wt 155 lb (70.3 kg)   SpO2 99%   BMI 28.35 kg/m   Assessment and Plan: 1. Acute midline low back pain with right-sided sciatica Continue heat or ice Take steroid taper Flexeril at bedtime Return or call if no improvement - predniSONE (DELTASONE) 10 MG tablet; Take 1 tablet (10 mg total) by mouth as directed for 6 days. Take 6,5,4,3,2,1 then stop  Dispense: 21 tablet; Refill: 0 - cyclobenzaprine (FLEXERIL) 10 MG tablet; Take 1 tablet (10 mg total) by mouth at bedtime.  Dispense: 30 tablet; Refill: 1  2. IUD (intrauterine device) in place - Ambulatory referral to Obstetrics / Gynecology   Partially dictated using Animal nutritionist. Any errors are unintentional.  Bari Edward, MD [facility]  12/18/2020

## 2020-12-21 ENCOUNTER — Telehealth: Payer: Self-pay

## 2020-12-21 NOTE — Telephone Encounter (Signed)
[facility] referring for IUD removal/replacement. Called and left voicemail for patient to call back to be scheduled.

## 2020-12-25 NOTE — Telephone Encounter (Signed)
Called and left voicemail for patient to call back to be scheduled. 

## 2020-12-27 ENCOUNTER — Other Ambulatory Visit: Payer: Self-pay | Admitting: Internal Medicine

## 2020-12-27 DIAGNOSIS — E282 Polycystic ovarian syndrome: Secondary | ICD-10-CM

## 2020-12-27 NOTE — Telephone Encounter (Signed)
Called and left voicemail for patient to call back to be scheduled. 

## 2020-12-27 NOTE — Telephone Encounter (Signed)
Requested medications are due for refill today yes  Requested medications are on the active medication list yes  Last refill 09/28/20  Last visit 07/2020  Future visit scheduled 07/2021  Notes to clinic Pharmacy note states pt only taking once a day instead of twice a day as prescribed.

## 2021-01-02 NOTE — Telephone Encounter (Signed)
Called and left voicemail for patient to call back to be scheduled. 

## 2021-01-07 ENCOUNTER — Telehealth: Payer: Self-pay | Admitting: Advanced Practice Midwife

## 2021-01-07 NOTE — Telephone Encounter (Signed)
Noted. Will order to arrive by apt date/time. 

## 2021-01-07 NOTE — Telephone Encounter (Signed)
Patient coming in for Mirena insertion with Erskine Squibb on 01/10/2021 @ 1:50pm

## 2021-01-08 ENCOUNTER — Encounter: Payer: Self-pay | Admitting: Internal Medicine

## 2021-01-10 ENCOUNTER — Encounter: Payer: Self-pay | Admitting: Advanced Practice Midwife

## 2021-01-10 ENCOUNTER — Ambulatory Visit (INDEPENDENT_AMBULATORY_CARE_PROVIDER_SITE_OTHER): Payer: BC Managed Care – PPO | Admitting: Advanced Practice Midwife

## 2021-01-10 ENCOUNTER — Other Ambulatory Visit: Payer: Self-pay

## 2021-01-10 VITALS — BP 158/90 | HR 109 | Ht 62.0 in | Wt 153.4 lb

## 2021-01-10 DIAGNOSIS — Z30431 Encounter for routine checking of intrauterine contraceptive device: Secondary | ICD-10-CM | POA: Diagnosis not present

## 2021-01-10 DIAGNOSIS — N951 Menopausal and female climacteric states: Secondary | ICD-10-CM | POA: Diagnosis not present

## 2021-01-10 NOTE — Patient Instructions (Signed)
https://www.womenshealth.gov/menopause/menopause-basics"> https://www.clinicalkey.com">  Menopause Menopause is the normal time of a woman's life when menstrual periods stop completely. It marks the natural end to a woman's ability to become pregnant. It can be defined as the absence of a menstrual period for 12 months without another medical cause. The transition to menopause (perimenopause) most often happens between the ages of 45 and 55, and can last for many years. During perimenopause, hormone levels change in your body, which can cause symptoms and affect your health. Menopause may increase your risk for:  Weakened bones (osteoporosis), which causes fractures.  Depression.  Hardening and narrowing of the arteries (atherosclerosis), which can cause heart attacks and strokes. What are the causes? This condition is usually caused by a natural change in hormone levels that happens as you get older. The condition may also be caused by changes that are not natural, including:  Surgery to remove both ovaries (surgical menopause).  Side effects from some medicines, such as chemotherapy used to treat cancer (chemical menopause). What increases the risk? This condition is more likely to start at an earlier age if you have certain medical conditions or have undergone treatments, including:  A tumor of the pituitary gland in the brain.  A disease that affects the ovaries and hormones.  Certain cancer treatments, such as chemotherapy or hormone therapy, or radiation therapy on the pelvis.  Heavy smoking and excessive alcohol use.  Family history of early menopause. This condition is also more likely to develop earlier in women who are very thin. What are the signs or symptoms? Symptoms of this condition include:  Hot flashes.  Irregular menstrual periods.  Night sweats.  Changes in feelings about sex. This could be a decrease in sex drive or an increased discomfort around your  sexuality.  Vaginal dryness and thinning of the vaginal walls. This may cause painful sex.  Dryness of the skin and development of wrinkles.  Headaches.  Problems sleeping (insomnia).  Mood swings or irritability.  Memory problems.  Weight gain.  Hair growth on the face and chest.  Bladder infections or problems with urinating. How is this diagnosed? This condition is diagnosed based on your medical history, a physical exam, your age, your menstrual history, and your symptoms. Hormone tests may also be done. How is this treated? In some cases, no treatment is needed. You and your health care provider should make a decision together about whether treatment is necessary. Treatment will be based on your individual condition and preferences. Treatment for this condition focuses on managing symptoms. Treatment may include:  Menopausal hormone therapy (MHT).  Medicines to treat specific symptoms or complications.  Acupuncture.  Vitamin or herbal supplements. Before starting treatment, make sure to let your health care provider know if you have a personal or family history of these conditions:  Heart disease.  Breast cancer.  Blood clots.  Diabetes.  Osteoporosis. Follow these instructions at home: Lifestyle  Do not use any products that contain nicotine or tobacco, such as cigarettes, e-cigarettes, and chewing tobacco. If you need help quitting, ask your health care provider.  Get at least 30 minutes of physical activity on 5 or more days each week.  Avoid alcoholic and caffeinated beverages, as well as spicy foods. This may help prevent hot flashes.  Get 7-8 hours of sleep each night.  If you have hot flashes, try: ? Dressing in layers. ? Avoiding things that may trigger hot flashes, such as spicy food, warm places, or stress. ? Taking slow, deep   breaths when a hot flash starts. ? Keeping a fan in your home and office.  Find ways to manage stress, such as deep  breathing, meditation, or journaling.  Consider going to group therapy with other women who are having menopause symptoms. Ask your health care provider about recommended group therapy meetings. Eating and drinking  Eat a healthy, balanced diet that contains whole grains, lean protein, low-fat dairy, and plenty of fruits and vegetables.  Your health care provider may recommend adding more soy to your diet. Foods that contain soy include tofu, tempeh, and soy milk.  Eat plenty of foods that contain calcium and vitamin D for bone health. Items that are rich in calcium include low-fat milk, yogurt, beans, almonds, sardines, broccoli, and kale.   Medicines  Take over-the-counter and prescription medicines only as told by your health care provider.  Talk with your health care provider before starting any herbal supplements. If prescribed, take vitamins and supplements as told by your health care provider. General instructions  Keep track of your menstrual periods, including: ? When they occur. ? How heavy they are and how long they last. ? How much time passes between periods.  Keep track of your symptoms, noting when they start, how often you have them, and how long they last.  Use vaginal lubricants or moisturizers to help with vaginal dryness and improve comfort during sex.  Keep all follow-up visits. This is important. This includes any group therapy or counseling.   Contact a health care provider if:  You are still having menstrual periods after age 55.  You have pain during sex.  You have not had a period for 12 months and you develop vaginal bleeding. Get help right away if you have:  Severe depression.  Excessive vaginal bleeding.  Pain when you urinate.  A fast or irregular heartbeat (palpitations).  Severe headaches.  Abdominal pain or severe indigestion. Summary  Menopause is a normal time of life when menstrual periods stop completely. It is usually defined as  the absence of a menstrual period for 12 months without another medical cause.  The transition to menopause (perimenopause) most often happens between the ages of 45 and 55 and can last for several years.  Symptoms can be managed through medicines, lifestyle changes, and complementary therapies such as acupuncture.  Eat a balanced diet that is rich in nutrients to promote bone health and heart health and to manage symptoms during menopause. This information is not intended to replace advice given to you by your health care provider. Make sure you discuss any questions you have with your health care provider. Document Revised: 07/27/2020 Document Reviewed: 04/12/2020 Elsevier Patient Education  2021 Elsevier Inc.  [employer] Textbook of Endocrinology (14th ed., pp. 574-641). [place], [place]: Elsevier.">  Perimenopause Perimenopause is the normal time of a woman's life when the levels of estrogen, the female hormone produced by the ovaries, begin to decrease. This leads to changes in menstrual periods before they stop completely (menopause). Perimenopause can begin 2-8 years before menopause. During perimenopause, the ovaries may or may not produce an egg and a woman can still become pregnant. What are the causes? This condition is caused by a natural change in hormone levels that happens as you get older. What increases the risk? This condition is more likely to start at an earlier age if you have certain medical conditions or have undergone treatments, including:  A tumor of the pituitary gland in the brain.  A disease that affects the   ovaries and hormone production.  Certain cancer treatments, such as chemotherapy or hormone therapy, or radiation therapy on the pelvis.  Heavy smoking and excessive alcohol use.  Family history of early menopause. What are the signs or symptoms? Perimenopausal changes affect each woman differently. Symptoms of this condition may include:  Hot  flashes.  Irregular menstrual periods.  Night sweats.  Changes in feelings about sex. This could be a decrease in sex drive or an increased discomfort around your sexuality.  Vaginal dryness.  Headaches.  Mood swings.  Depression.  Problems sleeping (insomnia).  Memory problems or trouble concentrating.  Irritability.  Tiredness.  Weight gain.  Anxiety.  Trouble getting pregnant. How is this diagnosed? This condition is diagnosed based on your medical history, a physical exam, your age, your menstrual history, and your symptoms. Hormone tests may also be done. How is this treated? In some cases, no treatment is needed. You and your health care provider should make a decision together about whether treatment is necessary. Treatment will be based on your individual condition and preferences. Various treatments are available, such as:  Menopausal hormone therapy (MHT).  Medicines to treat specific symptoms.  Acupuncture.  Vitamin or herbal supplements. Before starting treatment, make sure to let your health care provider know if you have a personal or family history of:  Heart disease.  Breast cancer.  Blood clots.  Diabetes.  Osteoporosis. Follow these instructions at home: Medicines  Take over-the-counter and prescription medicines only as told by your health care provider.  Take vitamin supplements only as told by your health care provider.  Talk with your health care provider before starting any herbal supplements. Lifestyle  Do not use any products that contain nicotine or tobacco, such as cigarettes, e-cigarettes, and chewing tobacco. If you need help quitting, ask your health care provider.  Get at least 30 minutes of physical activity on 5 or more days each week.  Eat a balanced diet that includes fresh fruits and vegetables, whole grains, soybeans, eggs, lean meat, and low-fat dairy.  Avoid alcoholic and caffeinated beverages, as well as spicy  foods. This may help prevent hot flashes.  Get 7-8 hours of sleep each night.  Dress in layers that can be removed to help you manage hot flashes.  Find ways to manage stress, such as deep breathing, meditation, or journaling.   General instructions  Keep track of your menstrual periods, including: ? When they occur. ? How heavy they are and how long they last. ? How much time passes between periods.  Keep track of your symptoms, noting when they start, how often you have them, and how long they last.  Use vaginal lubricants or moisturizers to help with vaginal dryness and improve comfort during sex.  You can still become pregnant if you are having irregular periods. Make sure you use contraception during perimenopause if you do not want to get pregnant.  Keep all follow-up visits. This is important. This includes any group therapy or counseling.   Contact a health care provider if:  You have heavy vaginal bleeding or pass blood clots.  Your period lasts more than 2 days longer than normal.  Your periods are recurring sooner than 21 days.  You bleed after having sex.  You have pain during sex. Get help right away if you have:  Chest pain, trouble breathing, or trouble talking.  Severe depression.  Pain when you urinate.  Severe headaches.  Vision problems. Summary  Perimenopause is the time when   a woman's body begins to move into menopause. This may happen naturally or as a result of other health problems or medical treatments.  Perimenopause can begin 2-8 years before menopause, and it can last for several years.  Perimenopausal symptoms can be managed through medicines, lifestyle changes, and complementary therapies such as acupuncture. This information is not intended to replace advice given to you by your health care provider. Make sure you discuss any questions you have with your health care provider. Document Revised: 04/12/2020 Document Reviewed:  04/12/2020 Elsevier Patient Education  2021 Elsevier Inc.  

## 2021-01-10 NOTE — Progress Notes (Signed)
Patient ID: Meagan Gordon, female   DOB: [birth year], 55 y.o.   MRN: [id number]  Reason for Consult: Contraception (Referral for IUD removal. Does not want replacement today will call back if she decides to have it replaced. Want hormone levels done.)   Referred by Reubin Milan, MD  Subjective:  HPI:  Meagan Gordon is a 55 y.o. female being seen for IUD check and hormone level check. Originally she scheduled the appointment for IUD removal and replacement due to 5 year lifespan of Mirena which was placed in 2017 at [facility] provider. Since then the Mirena has been approved for 7 year use. She has had irregular and infrequent spotting for a day at a time, every 3-6 months approximately, since the IUD was placed.   She is ok with checking the strings today- as she has not been able to feel them, and having her hormones checked. We discussed perimenopause symptoms and management. She has no other concerns at this time. Her PAP smear and mammogram are up to date.  Past Medical History:  Diagnosis Date  . Abnormal uterine bleeding 06/13/2015  . Diabetes mellitus without complication (HCC)   . GERD (gastroesophageal reflux disease)   . Hypertension   . PCOS (polycystic ovarian syndrome)    Family History  Problem Relation Age of Onset  . Stroke Mother   . Uterine cancer Mother 88  . Dementia Father   . Breast cancer Neg Hx    Past Surgical History:  Procedure Laterality Date  . CESAREAN SECTION  2000  . LEEP  1996    Short Social History:  Social History   Tobacco Use  . Smoking status: Never Smoker  . Smokeless tobacco: Never Used  Substance Use Topics  . Alcohol use: Yes    Comment: ocassional    No Known Allergies  Current Outpatient Medications  Medication Sig Dispense Refill  . ALPRAZolam (XANAX) 0.25 MG tablet Take 1 tablet (0.25 mg total) by mouth daily as needed for anxiety (related to dental procedures). 5 tablet 0  . cyclobenzaprine (FLEXERIL) 10 MG tablet Take 1  tablet (10 mg total) by mouth at bedtime. 30 tablet 1  . dicyclomine (BENTYL) 10 MG capsule TAKE 1 CAPSULE (10 MG TOTAL) BY MOUTH 3 (THREE) TIMES DAILY BEFORE MEALS. 270 capsule 1  . metFORMIN (GLUCOPHAGE) 500 MG tablet Take 500 mg by mouth daily with breakfast.     . omeprazole (PRILOSEC) 20 MG capsule TAKE 1 CAPSULE (20 MG TOTAL) BY MOUTH 2 (TWO) TIMES DAILY BEFORE A MEAL. 180 capsule 1  . spironolactone (ALDACTONE) 50 MG tablet TAKE 1 TABLET BY MOUTH TWICE A DAY 180 tablet 1  . levonorgestrel (MIRENA) 20 MCG/24HR IUD by Intrauterine route.    . metroNIDAZOLE (METROGEL VAGINAL) 0.75 % vaginal gel Place 1 Applicatorful vaginally 2 (two) times daily. 70 g 0   No current facility-administered medications for this visit.    Review of Systems  Constitutional: Negative for chills and fever.  HENT: Negative for congestion, ear discharge, ear pain, hearing loss, sinus pain and sore throat.   Eyes: Negative for blurred vision and double vision.  Respiratory: Negative for cough, shortness of breath and wheezing.   Cardiovascular: Negative for chest pain, palpitations and leg swelling.  Gastrointestinal: Negative for abdominal pain, blood in stool, constipation, diarrhea, heartburn, melena, nausea and vomiting.  Genitourinary: Negative for dysuria, flank pain, frequency, hematuria and urgency.       Positive for vaginal dryness  Musculoskeletal: Negative for back  pain, joint pain and myalgias.  Skin: Negative for itching and rash.  Neurological: Negative for dizziness, tingling, tremors, sensory change, speech change, focal weakness, seizures, loss of consciousness, weakness and headaches.  Endo/Heme/Allergies: Negative for environmental allergies. Does not bruise/bleed easily.       Positive for hot flashes  Psychiatric/Behavioral: Negative for depression, hallucinations, memory loss, substance abuse and suicidal ideas. The patient is not nervous/anxious and does not have insomnia.          Objective:  Objective   Vitals:   01/10/21 1346  BP: (!) 158/90  Pulse: (!) 109  Weight: 153 lb 6 oz (69.6 kg)  Height: 5\' 2"  (1.575 m)   Body mass index is 28.05 kg/m. Constitutional: Well nourished, well developed female in no acute distress.  HEENT: normal Skin: Warm and dry.    Extremity: no edema  Respiratory: Clear to auscultation bilateral. Normal respiratory effort Neuro: DTRs 2+, Cranial nerves grossly intact Psych: Alert and Oriented x3. No memory deficits. Normal mood and affect.  MS: normal gait, normal bilateral lower extremity ROM/strength/stability.  Pelvic exam:  is not limited by body habitus EGBUS: within normal limits Vagina: within normal limits and with normal mucosa  Cervix: normal appearance, IUD strings visualized and appear to be 3 cm     Assessment/Plan:     55 y.o. G2 P2 female IUD check, reproductive hormone level check   Labs: Estrogen, Progesterone, FSH/LH  Return to office as needed and in 2 years for IUD removal    44 CNM [facility] 01/10/2021, 2:30 PM

## 2021-01-11 LAB — FSH/LH
FSH: 46.5 m[IU]/mL
LH: 35.7 m[IU]/mL

## 2021-01-11 LAB — PROGESTERONE: Progesterone: 0.3 ng/mL

## 2021-01-11 LAB — ESTRADIOL: Estradiol: 11.8 pg/mL

## 2021-03-10 ENCOUNTER — Other Ambulatory Visit: Payer: Self-pay | Admitting: Internal Medicine

## 2021-03-10 DIAGNOSIS — M5441 Lumbago with sciatica, right side: Secondary | ICD-10-CM

## 2021-03-10 NOTE — Telephone Encounter (Signed)
Requested medication (s) are due for refill today: yes  Requested medication (s) are on the active medication list: yes  Last refill:  12/18/20  Future visit scheduled: yes  Notes to clinic:  med not delegated to NT to RF   Requested Prescriptions  Pending Prescriptions Disp Refills   cyclobenzaprine (FLEXERIL) 10 MG tablet [Pharmacy Med Name: CYCLOBENZAPRINE 10 MG TABLET] 30 tablet 1    Sig: TAKE 1 TABLET BY MOUTH EVERYDAY AT BEDTIME      Not Delegated - Analgesics:  Muscle Relaxants Failed - 03/10/2021  3:25 PM      Failed - This refill cannot be delegated      Passed - Valid encounter within last 6 months    Recent Outpatient Visits           2 months ago Acute midline low back pain with right-sided sciatica   [facility] Reubin Milan, MD   7 months ago Annual physical exam   [facility] Reubin Milan, MD   10 months ago Acute cystitis with hematuria   [facility] Reubin Milan, MD   1 year ago Prediabetes   [facility] Reubin Milan, MD   1 year ago Acute cystitis without hematuria   [facility] Reubin Milan, MD       Future Appointments             In 4 months Judithann Graves Nyoka Cowden, MD [facility], [facility]

## 2021-03-24 DIAGNOSIS — J019 Acute sinusitis, unspecified: Secondary | ICD-10-CM | POA: Diagnosis not present

## 2021-03-24 DIAGNOSIS — B9689 Other specified bacterial agents as the cause of diseases classified elsewhere: Secondary | ICD-10-CM | POA: Diagnosis not present

## 2021-03-25 ENCOUNTER — Ambulatory Visit: Payer: BC Managed Care – PPO | Admitting: Family Medicine

## 2021-03-25 ENCOUNTER — Other Ambulatory Visit: Payer: Self-pay

## 2021-03-25 ENCOUNTER — Encounter: Payer: Self-pay | Admitting: Family Medicine

## 2021-03-25 VITALS — BP 152/94 | HR 91 | Temp 98.1°F | Ht 62.0 in | Wt 155.0 lb

## 2021-03-25 DIAGNOSIS — B9689 Other specified bacterial agents as the cause of diseases classified elsewhere: Secondary | ICD-10-CM | POA: Diagnosis not present

## 2021-03-25 DIAGNOSIS — J019 Acute sinusitis, unspecified: Secondary | ICD-10-CM | POA: Diagnosis not present

## 2021-03-25 LAB — POCT INFLUENZA A/B
Influenza A, POC: NEGATIVE
Influenza B, POC: NEGATIVE

## 2021-03-25 MED ORDER — AMOXICILLIN 875 MG PO TABS: 875.0000 mg | ORAL_TABLET | Freq: Two times a day (BID) | ORAL | 0 refills | Status: DC

## 2021-03-25 NOTE — Progress Notes (Signed)
Primary Care / Sports Medicine Office Visit  Patient Information:  Patient ID: Meagan Gordon, female DOB: 1966/07/08 Age: 55 y.o. MRN: [id number]   Meagan Gordon is a pleasant 55 y.o. female presenting with the following:  Chief Complaint  Patient presents with  . Follow-up  . Nasal Congestion    Started 03/22/21; taking Mucinex liquid every 4 hours, stopped yesterday morning due to ineffectiveness  . Shortness of Breath    On exertion; also started 2-3 weeks ago    Review of Systems  Constitutional: Positive for fatigue. Negative for chills and fever.  HENT: Positive for congestion, nosebleeds, rhinorrhea, sinus pressure and trouble swallowing. Negative for dental problem and sore throat.   Eyes: Negative.   Respiratory: Positive for cough. Negative for shortness of breath.   Cardiovascular: Negative for chest pain.  Gastrointestinal: Negative.   Endocrine: Negative.   Genitourinary: Negative.   Musculoskeletal: Negative.   Allergic/Immunologic: Negative.   Neurological: Negative.   Hematological: Negative.   Psychiatric/Behavioral: Negative.    pertinent details above   Patient Active Problem List   Diagnosis Date Noted  . Acute bacterial rhinosinusitis 03/25/2021  . Neck muscle spasm 11/08/2018  . Tendonitis 07/09/2018  . Plantar fasciitis of left foot 07/09/2018  . PCOS (polycystic ovarian syndrome) 05/28/2018  . Gastroesophageal reflux disease without esophagitis 05/28/2018  . Abdominal pain, RUQ 05/28/2018  . Breast mass, right 07/19/2016  . Prediabetes 06/17/2015   Past Medical History:  Diagnosis Date  . Abnormal uterine bleeding 06/13/2015  . Diabetes mellitus without complication (HCC)   . GERD (gastroesophageal reflux disease)   . Hypertension   . PCOS (polycystic ovarian syndrome)    Outpatient Medications Prior to Visit  Medication Sig Dispense Refill  . ALPRAZolam (XANAX) 0.25 MG tablet Take 1 tablet (0.25 mg total) by mouth daily as needed for  anxiety (related to dental procedures). 5 tablet 0  . cyclobenzaprine (FLEXERIL) 10 MG tablet TAKE 1 TABLET BY MOUTH EVERYDAY AT BEDTIME 30 tablet 1  . dicyclomine (BENTYL) 10 MG capsule TAKE 1 CAPSULE (10 MG TOTAL) BY MOUTH 3 (THREE) TIMES DAILY BEFORE MEALS. 270 capsule 1  . levonorgestrel (MIRENA) 20 MCG/24HR IUD by Intrauterine route.    . metFORMIN (GLUCOPHAGE) 500 MG tablet Take 500 mg by mouth daily with breakfast.     . metroNIDAZOLE (METROGEL VAGINAL) 0.75 % vaginal gel Place 1 Applicatorful vaginally 2 (two) times daily. 70 g 0  . omeprazole (PRILOSEC) 20 MG capsule TAKE 1 CAPSULE (20 MG TOTAL) BY MOUTH 2 (TWO) TIMES DAILY BEFORE A MEAL. 180 capsule 1  . spironolactone (ALDACTONE) 50 MG tablet TAKE 1 TABLET BY MOUTH TWICE A DAY 180 tablet 1   No facility-administered medications prior to visit.    Past Surgical History:  Procedure Laterality Date  . CESAREAN SECTION  2000  . LEEP  1996   Social History   Socioeconomic History  . Marital status: Married    Spouse name: Not on file  . Number of children: 2  . Years of education: Not on file  . Highest education level: Not on file  Occupational History  . Not on file  Tobacco Use  . Smoking status: Never Smoker  . Smokeless tobacco: Never Used  Vaping Use  . Vaping Use: Never used  Substance and Sexual Activity  . Alcohol use: Yes  . Drug use: No  . Sexual activity: Yes    Birth control/protection: I.U.D.  Other Topics Concern  . Not on file  Social History Narrative  . Not on file   Social Determinants of Health   Financial Resource Strain: Not on file  Food Insecurity: Not on file  Transportation Needs: Not on file  Physical Activity: Not on file  Stress: Not on file  Social Connections: Not on file  Intimate Partner Violence: Not on file   Family History  Problem Relation Age of Onset  . Stroke Mother   . Uterine cancer Mother 79  . Dementia Father   . Breast cancer Neg Hx    No Known  Allergies  Vitals:   03/25/21 1354  BP: (!) 152/94  Pulse: 91  Temp: 98.1 F (36.7 C)  SpO2: 98%   Vitals:   03/25/21 1354  Weight: 155 lb (70.3 kg)  Height: 5\' 2"  (1.575 m)   Body mass index is 28.35 kg/m.  No results found.   Physical Exam General: Well Developed, well nourished, and in no acute distress.  Neuro: Alert and oriented x3, extra-ocular muscles intact, sensation grossly intact. Cranial nerves II through XII are intact, motor, sensory, and coordinative functions are grossly intact. HEENT: Normocephalic, atraumatic, pupils equal round reactive to light, neck supple, no masses, tender right anterior cervical LAD, thyroid nonpalpable. whitish coat on tongue, Oropharynx non-erythematous, nasopharynx erythemtous, external ear canals are unremarkable. Bilateral tympanic membranes benign. tenderness bilateral maxillary sinuses Skin: Warm and dry, no rashes noted.  Cardiac: Regular rate and rhythm, no murmurs, rubs, or gallops. Respiratory: Clear to auscultation bilaterally. Not using accessory muscles, speaking in full sentences.  Abdominal: Soft, nontender, nondistended, positive bowel sounds, no masses, no organomegaly.  Musculoskeletal: Shoulder, elbow, wrist, hip, knee, ankle stable, and with full range of motion.   Independent interpretation of notes and tests performed by another provider:   None  Procedures performed:   None  Pertinent History, Exam, Impression, and Recommendations:   Acute bacterial rhinosinusitis Patient with 3 days history of nasal congestion, sinus pain, and initially chest congestion. Denies any fevers, chills, myalgias, sick contacts. She trialed Mucinex liquid without relief. Given her physical exam findings and negative flu test, plan for empiric treatment for bacterial rhinosinusitis with amoxicillin 875mg  BID x 14 days. There is a Covid test ordered and we will contact the patient if positive.   I have also advised supportive care  with intranasal steroid and antihistamine. If symptoms persist, she was advised to contact our office.     Orders & Medications Meds ordered this encounter  Medications  . amoxicillin (AMOXIL) 875 MG tablet    Sig: Take 1 tablet (875 mg total) by mouth 2 (two) times daily.    Dispense:  28 tablet    Refill:  0   Orders Placed This Encounter  Procedures  . Novel Coronavirus, NAA ([facility])  . POCT Influenza A/B     Return if symptoms worsen or fail to improve.     , MD   Primary Care Sports Medicine [facility]

## 2021-03-25 NOTE — Patient Instructions (Addendum)
-   Take antibiotics for full course - Recommend Flonase daily while on antibiotics - Take antihistamine (Zyrtec) daily while on antibiotics - Drink plenty of water and get plenty of rest - Contact us for any change in symptoms or if symptoms persist towards the end of the antbiotics

## 2021-03-25 NOTE — Assessment & Plan Note (Signed)
Patient with 3 days history of nasal congestion, sinus pain, and initially chest congestion. Denies any fevers, chills, myalgias, sick contacts. She trialed Mucinex liquid without relief. Given her physical exam findings and negative flu test, plan for empiric treatment for bacterial rhinosinusitis with amoxicillin 875mg  BID x 14 days. There is a Covid test ordered and we will contact the patient if positive.   I have also advised supportive care with intranasal steroid and antihistamine. If symptoms persist, she was advised to contact our office.

## 2021-03-26 ENCOUNTER — Other Ambulatory Visit: Payer: Self-pay | Admitting: Internal Medicine

## 2021-03-26 DIAGNOSIS — R1011 Right upper quadrant pain: Secondary | ICD-10-CM

## 2021-03-26 DIAGNOSIS — K219 Gastro-esophageal reflux disease without esophagitis: Secondary | ICD-10-CM

## 2021-03-26 LAB — SARS-COV-2, NAA 2 DAY TAT

## 2021-03-26 LAB — NOVEL CORONAVIRUS, NAA: SARS-CoV-2, NAA: NOT DETECTED

## 2021-05-15 ENCOUNTER — Other Ambulatory Visit: Payer: Self-pay | Admitting: Internal Medicine

## 2021-05-15 DIAGNOSIS — M5441 Lumbago with sciatica, right side: Secondary | ICD-10-CM

## 2021-05-15 NOTE — Telephone Encounter (Signed)
Requested medication (s) are due for refill today: yes  Requested medication (s) are on the active medication list: yes  Last refill:  03/11/21 #30 1 refill  Future visit scheduled: yes in 2 months   Notes to clinic:  not delegated per protocol     Requested Prescriptions  Pending Prescriptions Disp Refills   cyclobenzaprine (FLEXERIL) 10 MG tablet [Pharmacy Med Name: CYCLOBENZAPRINE 10 MG TABLET] 30 tablet 1    Sig: TAKE 1 TABLET BY MOUTH EVERYDAY AT BEDTIME      Not Delegated - Analgesics:  Muscle Relaxants Failed - 05/15/2021  1:50 PM      Failed - This refill cannot be delegated      Passed - Valid encounter within last 6 months    Recent Outpatient Visits           1 month ago Acute bacterial rhinosinusitis   [facility] Jerrol Banana, MD   4 months ago Acute midline low back pain with right-sided sciatica   [facility] Reubin Milan, MD   9 months ago Annual physical exam   [facility] Reubin Milan, MD   1 year ago Acute cystitis with hematuria   [facility] Reubin Milan, MD   1 year ago Prediabetes   [facility] Reubin Milan, MD       Future Appointments             In 2 months Judithann Graves Nyoka Cowden, MD [facility], [facility]

## 2021-06-26 ENCOUNTER — Other Ambulatory Visit: Payer: Self-pay | Admitting: Internal Medicine

## 2021-06-26 DIAGNOSIS — E282 Polycystic ovarian syndrome: Secondary | ICD-10-CM

## 2021-06-26 NOTE — Telephone Encounter (Signed)
Requested Prescriptions  Pending Prescriptions Disp Refills  . spironolactone (ALDACTONE) 50 MG tablet [Pharmacy Med Name: SPIRONOLACTONE 50 MG TABLET] 180 tablet 0    Sig: TAKE 1 TABLET BY MOUTH TWICE A DAY     Cardiovascular: Diuretics - Aldosterone Antagonist Failed - 06/26/2021  3:00 AM      Failed - Last BP in normal range    BP Readings from Last 1 Encounters:  03/25/21 (!) 152/94         Passed - Cr in normal range and within 360 days    Creatinine, Ser  Date Value Ref Range Status  07/31/2020 0.72 0.44 - 1.00 mg/dL Final         Passed - K in normal range and within 360 days    Potassium  Date Value Ref Range Status  07/31/2020 4.2 3.5 - 5.1 mmol/L Final         Passed - Na in normal range and within 360 days    Sodium  Date Value Ref Range Status  07/31/2020 138 135 - 145 mmol/L Final  01/10/2020 139 134 - 144 mmol/L Final         Passed - Valid encounter within last 6 months    Recent Outpatient Visits          3 months ago Acute bacterial rhinosinusitis   [facility] Jerrol Banana, MD   6 months ago Acute midline low back pain with right-sided sciatica   [facility] Reubin Milan, MD   11 months ago Annual physical exam   [facility] Reubin Milan, MD   1 year ago Acute cystitis with hematuria   [facility] Reubin Milan, MD   1 year ago Prediabetes   [facility] Reubin Milan, MD      Future Appointments            In 1 month Judithann Graves, Nyoka Cowden, MD [facility], [facility]

## 2021-08-01 ENCOUNTER — Other Ambulatory Visit: Payer: Self-pay | Admitting: Internal Medicine

## 2021-08-01 DIAGNOSIS — Z1231 Encounter for screening mammogram for malignant neoplasm of breast: Secondary | ICD-10-CM

## 2021-08-02 ENCOUNTER — Ambulatory Visit (INDEPENDENT_AMBULATORY_CARE_PROVIDER_SITE_OTHER): Payer: BC Managed Care – PPO | Admitting: Internal Medicine

## 2021-08-02 ENCOUNTER — Encounter: Payer: Self-pay | Admitting: Internal Medicine

## 2021-08-02 ENCOUNTER — Other Ambulatory Visit: Payer: Self-pay

## 2021-08-02 VITALS — BP 146/78 | HR 75 | Ht 62.0 in | Wt 154.2 lb

## 2021-08-02 DIAGNOSIS — Z1231 Encounter for screening mammogram for malignant neoplasm of breast: Secondary | ICD-10-CM

## 2021-08-02 DIAGNOSIS — E782 Mixed hyperlipidemia: Secondary | ICD-10-CM | POA: Diagnosis not present

## 2021-08-02 DIAGNOSIS — K219 Gastro-esophageal reflux disease without esophagitis: Secondary | ICD-10-CM

## 2021-08-02 DIAGNOSIS — R7303 Prediabetes: Secondary | ICD-10-CM

## 2021-08-02 DIAGNOSIS — Z0000 Encounter for general adult medical examination without abnormal findings: Secondary | ICD-10-CM | POA: Diagnosis not present

## 2021-08-02 DIAGNOSIS — Z1211 Encounter for screening for malignant neoplasm of colon: Secondary | ICD-10-CM

## 2021-08-02 DIAGNOSIS — E282 Polycystic ovarian syndrome: Secondary | ICD-10-CM

## 2021-08-02 LAB — POCT URINALYSIS DIPSTICK
Bilirubin, UA: NEGATIVE
Blood, UA: NEGATIVE
Glucose, UA: NEGATIVE
Ketones, UA: NEGATIVE
Leukocytes, UA: NEGATIVE
Nitrite, UA: NEGATIVE
Protein, UA: NEGATIVE
Spec Grav, UA: 1.01 (ref 1.010–1.025)
Urobilinogen, UA: 0.2 E.U./dL
pH, UA: 7.5 (ref 5.0–8.0)

## 2021-08-02 NOTE — Progress Notes (Signed)
Date:  08/02/2021   Name:  Meagan Gordon   DOB:  1966/04/19   MRN:  [id number]   Chief Complaint: Annual Exam (Breast Exam. No pap until next year.) Meagan Gordon is a 55 y.o. female who presents today for her Complete Annual Exam. She feels fairly well. She reports exercising - walking 3 days weekly. She reports she is sleeping poorly. Breast complaints - none.  Mammogram: 08/2020 DEXA: none Pap smear: 01/2017 neg with co-testing Colonoscopy: 08/2020 FIT  Immunization History  Administered Date(s) Administered   Influenza,inj,Quad PF,6+ Mos 09/28/2018   Influenza-Unspecified 09/30/2019   Tdap 05/28/2018    Gastroesophageal Reflux She complains of heartburn. She reports no abdominal pain, no chest pain, no coughing or no wheezing. This is a recurrent problem. The problem occurs occasionally. Pertinent negatives include no fatigue. She has tried a PPI for the symptoms.  Diabetes She presents for her follow-up diabetic visit. Diabetes type: prediabetes. Her disease course has been stable. Pertinent negatives for hypoglycemia include no dizziness, headaches, nervousness/anxiousness or tremors. Pertinent negatives for diabetes include no chest pain, no fatigue, no polydipsia and no polyuria. Current diabetic treatment includes oral agent (monotherapy) (on metformin for PCOS).   Lab Results  Component Value Date   CREATININE 0.72 07/31/2020   BUN 10 07/31/2020   NA 138 07/31/2020   K 4.2 07/31/2020   CL 102 07/31/2020   CO2 29 07/31/2020   Lab Results  Component Value Date   CHOL 195 07/31/2020   HDL 43 07/31/2020   LDLCALC 133 (H) 07/31/2020   TRIG 93 07/31/2020   CHOLHDL 4.5 07/31/2020   Lab Results  Component Value Date   TSH 1.645 07/31/2020   Lab Results  Component Value Date   HGBA1C 6.4 (H) 07/31/2020   Lab Results  Component Value Date   WBC 7.1 07/31/2020   HGB 13.7 07/31/2020   HCT 39.8 07/31/2020   MCV 90.2 07/31/2020   PLT 315 07/31/2020   Lab Results   Component Value Date   ALT 15 07/31/2020   AST 14 (L) 07/31/2020   ALKPHOS 97 07/31/2020   BILITOT 0.6 07/31/2020     Review of Systems  Constitutional:  Negative for chills, fatigue, fever and unexpected weight change.  HENT:  Negative for congestion, hearing loss, tinnitus, trouble swallowing and voice change.   Eyes:  Negative for visual disturbance.  Respiratory:  Negative for cough, chest tightness, shortness of breath and wheezing.   Cardiovascular:  Negative for chest pain, palpitations and leg swelling.  Gastrointestinal:  Positive for abdominal distention (intermittent) and heartburn. Negative for abdominal pain, constipation, diarrhea and vomiting.  Endocrine: Negative for polydipsia and polyuria.  Genitourinary:  Positive for frequency. Negative for dysuria, genital sores, vaginal bleeding and vaginal discharge.  Musculoskeletal:  Negative for arthralgias, gait problem and joint swelling.  Skin:  Negative for color change and rash.  Neurological:  Negative for dizziness, tremors, light-headedness and headaches.  Hematological:  Negative for adenopathy. Does not bruise/bleed easily.  Psychiatric/Behavioral:  Negative for dysphoric mood and sleep disturbance. The patient is not nervous/anxious.    Patient Active Problem List   Diagnosis Date Noted   Acute bacterial rhinosinusitis 03/25/2021   Neck muscle spasm 11/08/2018   Tendonitis 07/09/2018   Plantar fasciitis of left foot 07/09/2018   PCOS (polycystic ovarian syndrome) 05/28/2018   Gastroesophageal reflux disease without esophagitis 05/28/2018   Abdominal pain, RUQ 05/28/2018   Breast mass, right 07/19/2016   Prediabetes 06/17/2015    No  Known Allergies  Past Surgical History:  Procedure Laterality Date   CESAREAN SECTION  2000   LEEP  1996    Social History   Tobacco Use   Smoking status: Never   Smokeless tobacco: Never  Vaping Use   Vaping Use: Never used  Substance Use Topics   Alcohol use: Yes    Drug use: No     Medication list has been reviewed and updated.  Current Meds  Medication Sig   ALPRAZolam (XANAX) 0.25 MG tablet Take 1 tablet (0.25 mg total) by mouth daily as needed for anxiety (related to dental procedures).   cyclobenzaprine (FLEXERIL) 10 MG tablet TAKE 1 TABLET BY MOUTH EVERYDAY AT BEDTIME   dicyclomine (BENTYL) 10 MG capsule TAKE 1 CAPSULE (10 MG TOTAL) BY MOUTH 3 (THREE) TIMES DAILY BEFORE MEALS.   levonorgestrel (MIRENA) 20 MCG/24HR IUD by Intrauterine route.   metFORMIN (GLUCOPHAGE) 500 MG tablet Take 500 mg by mouth daily with breakfast.    omeprazole (PRILOSEC) 20 MG capsule TAKE 1 CAPSULE (20 MG TOTAL) BY MOUTH 2 (TWO) TIMES DAILY BEFORE A MEAL.   spironolactone (ALDACTONE) 50 MG tablet TAKE 1 TABLET BY MOUTH TWICE A DAY    PHQ 2/9 Scores 08/02/2021 03/25/2021 12/18/2020 07/31/2020  PHQ - 2 Score 1 2 0 0  PHQ- 9 Score 8 11 0 0    GAD 7 : Generalized Anxiety Score 08/02/2021 03/25/2021 12/18/2020 07/31/2020  Nervous, Anxious, on Edge 1 1 0 0  Control/stop worrying 1 1 0 0  Worry too much - different things 1 1 0 0  Trouble relaxing 1 1 0 0  Restless 1 1 0 0  Easily annoyed or irritable 0 0 0 0  Afraid - awful might happen 1 0 0 0  Total GAD 7 Score 6 5 0 0  Anxiety Difficulty Somewhat difficult Somewhat difficult Not difficult at all Not difficult at all    BP Readings from Last 3 Encounters:  08/02/21 (!) 146/78  03/25/21 (!) 152/94  01/10/21 (!) 158/90    Physical Exam Vitals and nursing note reviewed.  Constitutional:      General: She is not in acute distress.    Appearance: She is well-developed.  HENT:     Head: Normocephalic and atraumatic.     Right Ear: Tympanic membrane and ear canal normal.     Left Ear: Tympanic membrane and ear canal normal.     Nose:     Right Sinus: No maxillary sinus tenderness.     Left Sinus: No maxillary sinus tenderness.  Eyes:     General: No scleral icterus.       Right eye: No discharge.        Left  eye: No discharge.     Conjunctiva/sclera: Conjunctivae normal.  Neck:     Thyroid: No thyromegaly.     Vascular: No carotid bruit.  Cardiovascular:     Rate and Rhythm: Normal rate and regular rhythm.     Pulses: Normal pulses.     Heart sounds: Normal heart sounds.  Pulmonary:     Effort: Pulmonary effort is normal. No respiratory distress.     Breath sounds: No wheezing.  Chest:  Breasts:    Right: No mass, nipple discharge, skin change or tenderness.     Left: No mass, nipple discharge, skin change or tenderness.  Abdominal:     General: Bowel sounds are normal.     Palpations: Abdomen is soft.     Tenderness: There  is no abdominal tenderness.  Musculoskeletal:     Cervical back: Normal range of motion. No erythema.     Right lower leg: No edema.     Left lower leg: No edema.  Lymphadenopathy:     Cervical: No cervical adenopathy.  Skin:    General: Skin is warm and dry.     Capillary Refill: Capillary refill takes less than 2 seconds.     Findings: No rash.  Neurological:     General: No focal deficit present.     Mental Status: She is alert and oriented to person, place, and time.     Cranial Nerves: No cranial nerve deficit.     Sensory: No sensory deficit.     Deep Tendon Reflexes: Reflexes are normal and symmetric.  Psychiatric:        Attention and Perception: Attention normal.        Mood and Affect: Mood normal.    Wt Readings from Last 3 Encounters:  08/02/21 154 lb 3.2 oz (69.9 kg)  03/25/21 155 lb (70.3 kg)  01/10/21 153 lb 6 oz (69.6 kg)    BP (!) 146/78   Pulse 75   Ht 5\' 2"  (1.575 m)   Wt 154 lb 3.2 oz (69.9 kg)   BMI 28.20 kg/m   Assessment and Plan: 1. Annual physical exam Normal exam except for BP. She will monitor at home and return if it remains elevated. Work on diet, exercise and weight - Lipid panel - TSH - POCT Urinalysis Dipstick - HIV Antibody (routine testing w rflx)  2. Encounter for screening mammogram for breast  cancer Scheduled for October.  3. Gastroesophageal reflux disease without esophagitis Symptoms well controlled on daily PPI No red flag signs such as weight loss, n/v, melena Will continue omeprazole. - CBC with Differential/Platelet  4. PCOS (polycystic ovarian syndrome) Continue spironolactone and metformin  5. Prediabetes Obtain labs then advise On metformin for PCOS - Comprehensive metabolic panel - Hemoglobin A1c  6. Colon cancer screening Encouraged pt to consider colonoscopy due to vague abdominal complaints - she will consider - Fecal occult blood, imunochemical   Partially dictated using November. Any errors are unintentional.  Animal nutritionist, MD [facility]  08/02/2021

## 2021-08-02 NOTE — Patient Instructions (Signed)
Check BP at home and record once a week. Follow up if over 140/90.

## 2021-08-03 LAB — COMPREHENSIVE METABOLIC PANEL
ALT: 17 IU/L (ref 0–32)
AST: 16 IU/L (ref 0–40)
Albumin/Globulin Ratio: 1.7 (ref 1.2–2.2)
Albumin: 4.4 g/dL (ref 3.8–4.9)
Alkaline Phosphatase: 127 IU/L — ABNORMAL HIGH (ref 44–121)
BUN/Creatinine Ratio: 12 (ref 9–23)
BUN: 9 mg/dL (ref 6–24)
Bilirubin Total: 0.3 mg/dL (ref 0.0–1.2)
CO2: 23 mmol/L (ref 20–29)
Calcium: 9.7 mg/dL (ref 8.7–10.2)
Chloride: 98 mmol/L (ref 96–106)
Creatinine, Ser: 0.76 mg/dL (ref 0.57–1.00)
Globulin, Total: 2.6 g/dL (ref 1.5–4.5)
Glucose: 207 mg/dL — ABNORMAL HIGH (ref 65–99)
Potassium: 4.4 mmol/L (ref 3.5–5.2)
Sodium: 137 mmol/L (ref 134–144)
Total Protein: 7 g/dL (ref 6.0–8.5)
eGFR: 94 mL/min/{1.73_m2} (ref >59)

## 2021-08-03 LAB — LIPID PANEL
Chol/HDL Ratio: 4.7 ratio — ABNORMAL HIGH (ref 0.0–4.4)
Cholesterol, Total: 202 mg/dL — ABNORMAL HIGH (ref 100–199)
HDL: 43 mg/dL (ref >39)
LDL Chol Calc (NIH): 139 mg/dL — ABNORMAL HIGH (ref 0–99)
Triglycerides: 113 mg/dL (ref 0–149)
VLDL Cholesterol Cal: 20 mg/dL (ref 5–40)

## 2021-08-03 LAB — TSH: TSH: 2.32 u[IU]/mL (ref 0.450–4.500)

## 2021-08-03 LAB — CBC WITH DIFFERENTIAL/PLATELET
Basophils Absolute: 0 10*3/uL (ref 0.0–0.2)
Basos: 0 %
EOS (ABSOLUTE): 0.1 10*3/uL (ref 0.0–0.4)
Eos: 1 %
Hematocrit: 42.3 % (ref 34.0–46.6)
Hemoglobin: 14.2 g/dL (ref 11.1–15.9)
Immature Grans (Abs): 0 10*3/uL (ref 0.0–0.1)
Immature Granulocytes: 0 %
Lymphocytes Absolute: 1.9 10*3/uL (ref 0.7–3.1)
Lymphs: 23 %
MCH: 30.2 pg (ref 26.6–33.0)
MCHC: 33.6 g/dL (ref 31.5–35.7)
MCV: 90 fL (ref 79–97)
Monocytes Absolute: 0.6 10*3/uL (ref 0.1–0.9)
Monocytes: 7 %
Neutrophils Absolute: 5.7 10*3/uL (ref 1.4–7.0)
Neutrophils: 69 %
Platelets: 347 10*3/uL (ref 150–450)
RBC: 4.7 x10E6/uL (ref 3.77–5.28)
RDW: 11.8 % (ref 11.7–15.4)
WBC: 8.4 10*3/uL (ref 3.4–10.8)

## 2021-08-03 LAB — HEMOGLOBIN A1C
Est. average glucose Bld gHb Est-mCnc: 192 mg/dL
Hgb A1c MFr Bld: 8.3 % — ABNORMAL HIGH (ref 4.8–5.6)

## 2021-08-03 LAB — HIV ANTIBODY (ROUTINE TESTING W REFLEX): HIV Screen 4th Generation wRfx: NONREACTIVE

## 2021-08-04 ENCOUNTER — Other Ambulatory Visit: Payer: Self-pay | Admitting: Internal Medicine

## 2021-08-04 DIAGNOSIS — M5441 Lumbago with sciatica, right side: Secondary | ICD-10-CM

## 2021-08-05 NOTE — Telephone Encounter (Signed)
Requested medication (s) are due for refill today: yes  Requested medication (s) are on the active medication list: yes  Last refill:  05/15/21 #30 1 RF  Future visit scheduled: yes  Notes to clinic:  med not delegated to NT to RF   Requested Prescriptions  Pending Prescriptions Disp Refills   cyclobenzaprine (FLEXERIL) 10 MG tablet [Pharmacy Med Name: CYCLOBENZAPRINE 10 MG TABLET] 30 tablet 1    Sig: TAKE 1 TABLET BY MOUTH EVERYDAY AT BEDTIME     Not Delegated - Analgesics:  Muscle Relaxants Failed - 08/04/2021  3:52 PM      Failed - This refill cannot be delegated      Passed - Valid encounter within last 6 months    Recent Outpatient Visits           3 days ago Annual physical exam   [facility] Reubin Milan, MD   4 months ago Acute bacterial rhinosinusitis   [facility] Jerrol Banana, MD   7 months ago Acute midline low back pain with right-sided sciatica   [facility] Reubin Milan, MD   1 year ago Annual physical exam   [facility] Reubin Milan, MD   1 year ago Acute cystitis with hematuria   [facility] Reubin Milan, MD       Future Appointments             In 5 months Judithann Graves Nyoka Cowden, MD [facility]   In 1 year Judithann Graves, Nyoka Cowden, MD [facility], [facility]

## 2021-08-19 ENCOUNTER — Other Ambulatory Visit: Payer: Self-pay

## 2021-08-19 ENCOUNTER — Ambulatory Visit
Admission: RE | Admit: 2021-08-19 | Discharge: 2021-08-19 | Disposition: A | Discharge status: Home / Self Care | Payer: BC Managed Care – PPO | Source: Ambulatory Visit | Attending: Internal Medicine | Admitting: Internal Medicine

## 2021-08-19 DIAGNOSIS — Z1231 Encounter for screening mammogram for malignant neoplasm of breast: Secondary | ICD-10-CM | POA: Diagnosis not present

## 2021-08-23 ENCOUNTER — Other Ambulatory Visit: Payer: Self-pay | Admitting: Internal Medicine

## 2021-08-23 DIAGNOSIS — N6489 Other specified disorders of breast: Secondary | ICD-10-CM

## 2021-08-23 DIAGNOSIS — R928 Other abnormal and inconclusive findings on diagnostic imaging of breast: Secondary | ICD-10-CM

## 2021-08-23 DIAGNOSIS — R921 Mammographic calcification found on diagnostic imaging of breast: Secondary | ICD-10-CM

## 2021-09-04 ENCOUNTER — Other Ambulatory Visit: Payer: Self-pay | Admitting: Internal Medicine

## 2021-09-04 ENCOUNTER — Ambulatory Visit
Admission: RE | Admit: 2021-09-04 | Discharge: 2021-09-04 | Disposition: A | Discharge status: Home / Self Care | Payer: BC Managed Care – PPO | Source: Ambulatory Visit | Attending: Internal Medicine | Admitting: Internal Medicine

## 2021-09-04 ENCOUNTER — Other Ambulatory Visit: Payer: Self-pay

## 2021-09-04 DIAGNOSIS — R921 Mammographic calcification found on diagnostic imaging of breast: Secondary | ICD-10-CM

## 2021-09-04 DIAGNOSIS — R928 Other abnormal and inconclusive findings on diagnostic imaging of breast: Secondary | ICD-10-CM

## 2021-09-04 DIAGNOSIS — N6489 Other specified disorders of breast: Secondary | ICD-10-CM | POA: Insufficient documentation

## 2021-09-04 DIAGNOSIS — R922 Inconclusive mammogram: Secondary | ICD-10-CM | POA: Diagnosis not present

## 2021-09-11 ENCOUNTER — Ambulatory Visit
Admission: RE | Admit: 2021-09-11 | Discharge: 2021-09-11 | Disposition: A | Discharge status: Home / Self Care | Payer: BC Managed Care – PPO | Source: Ambulatory Visit | Attending: Internal Medicine | Admitting: Internal Medicine

## 2021-09-11 ENCOUNTER — Other Ambulatory Visit: Payer: Self-pay

## 2021-09-11 DIAGNOSIS — R921 Mammographic calcification found on diagnostic imaging of breast: Secondary | ICD-10-CM

## 2021-09-11 DIAGNOSIS — R928 Other abnormal and inconclusive findings on diagnostic imaging of breast: Secondary | ICD-10-CM | POA: Diagnosis not present

## 2021-09-11 DIAGNOSIS — N6082 Other benign mammary dysplasias of left breast: Secondary | ICD-10-CM | POA: Diagnosis not present

## 2021-09-11 HISTORY — PX: BREAST BIOPSY: SHX20

## 2021-09-12 LAB — SURGICAL PATHOLOGY

## 2021-09-18 ENCOUNTER — Telehealth: Payer: BC Managed Care – PPO | Admitting: Physician Assistant

## 2021-09-18 DIAGNOSIS — B9689 Other specified bacterial agents as the cause of diseases classified elsewhere: Secondary | ICD-10-CM

## 2021-09-18 DIAGNOSIS — J019 Acute sinusitis, unspecified: Secondary | ICD-10-CM | POA: Diagnosis not present

## 2021-09-18 MED ORDER — DOXYCYCLINE HYCLATE 100 MG PO TABS: 100.0000 mg | ORAL_TABLET | Freq: Two times a day (BID) | ORAL | 0 refills | Status: DC

## 2021-09-18 NOTE — Progress Notes (Signed)
E-Visit for Sinus Problems  We are sorry that you are not feeling well.  Here is how we plan to help!  Based on what you have shared with me it looks like you have sinusitis.  Sinusitis is inflammation and infection in the sinus cavities of the head.  Based on your presentation I believe you most likely have Acute Bacterial Sinusitis.  This is an infection caused by bacteria and is treated with antibiotics. I have prescribed Doxycycline 100mg  by mouth twice a day for 10 days. (This is a small tablet) You may use an oral decongestant such as Mucinex D or if you have glaucoma or high blood pressure use plain Mucinex. Saline nasal spray help and can safely be used as often as needed for congestion.  If you develop worsening sinus pain, fever or notice severe headache and vision changes, or if symptoms are not better after completion of antibiotic, please schedule an appointment with a health care provider.    Sinus infections are not as easily transmitted as other respiratory infection, however we still recommend that you avoid close contact with loved ones, especially the very young and elderly.  Remember to wash your hands thoroughly throughout the day as this is the number one way to prevent the spread of infection!  Home Care: Only take medications as instructed by your medical team. Complete the entire course of an antibiotic. Do not take these medications with alcohol. A steam or ultrasonic humidifier can help congestion.  You can place a towel over your head and breathe in the steam from hot water coming from a faucet. Avoid close contacts especially the very young and the elderly. Cover your mouth when you cough or sneeze. Always remember to wash your hands.  Get Help Right Away If: You develop worsening fever or sinus pain. You develop a severe head ache or visual changes. Your symptoms persist after you have completed your treatment plan.  Make sure you Understand these  instructions. Will watch your condition. Will get help right away if you are not doing well or get worse.  Thank you for choosing an e-visit.  Your e-visit answers were reviewed by a board certified advanced clinical practitioner to complete your personal care plan. Depending upon the condition, your plan could have included both over the counter or prescription medications.  Please review your pharmacy choice. Make sure the pharmacy is open so you can pick up prescription now. If there is a problem, you may contact your provider through and have the prescription routed to another pharmacy.  Your safety is important to [employer]. If you have drug allergies check your prescription carefully.   For the next 24 hours you can use MyChart to ask questions about today's visit, request a non-urgent call back, or ask for a work or school excuse. You will get an email in the next two days asking about your experience. I hope that your e-visit has been valuable and will speed your recovery.

## 2021-09-18 NOTE — Progress Notes (Signed)
I have spent 5 minutes in review of e-visit questionnaire, review and updating patient chart, medical decision making and response to patient.   Omesha Bowerman Cody Shir Bergman, PA-C    

## 2021-09-19 MED ORDER — ONDANSETRON HCL 4 MG PO TABS: 4.0000 mg | ORAL_TABLET | Freq: Three times a day (TID) | ORAL | 0 refills | Status: DC | PRN

## 2021-09-19 NOTE — Addendum Note (Signed)
Addended by: Margaretann Loveless on: 09/19/2021 02:49 PM   Modules accepted: Orders

## 2021-09-25 DIAGNOSIS — D229 Melanocytic nevi, unspecified: Secondary | ICD-10-CM | POA: Diagnosis not present

## 2021-09-25 DIAGNOSIS — L814 Other melanin hyperpigmentation: Secondary | ICD-10-CM | POA: Diagnosis not present

## 2021-09-25 DIAGNOSIS — L821 Other seborrheic keratosis: Secondary | ICD-10-CM | POA: Diagnosis not present

## 2021-09-25 DIAGNOSIS — L820 Inflamed seborrheic keratosis: Secondary | ICD-10-CM | POA: Diagnosis not present

## 2021-10-01 DIAGNOSIS — E782 Mixed hyperlipidemia: Secondary | ICD-10-CM | POA: Insufficient documentation

## 2021-10-01 DIAGNOSIS — E785 Hyperlipidemia, unspecified: Secondary | ICD-10-CM | POA: Insufficient documentation

## 2021-10-01 DIAGNOSIS — E1169 Type 2 diabetes mellitus with other specified complication: Secondary | ICD-10-CM | POA: Insufficient documentation

## 2021-10-05 DIAGNOSIS — Z1211 Encounter for screening for malignant neoplasm of colon: Secondary | ICD-10-CM | POA: Diagnosis not present

## 2021-10-07 LAB — FECAL OCCULT BLOOD, IMMUNOCHEMICAL: Fecal Occult Bld: NEGATIVE

## 2021-10-07 LAB — SPECIMEN STATUS REPORT

## 2021-10-09 NOTE — Telephone Encounter (Signed)
Not due d/t 7 year indication. Mirena not rcvd

## 2021-11-11 ENCOUNTER — Other Ambulatory Visit: Payer: Self-pay

## 2021-11-11 ENCOUNTER — Ambulatory Visit
Admission: EM | Admit: 2021-11-11 | Discharge: 2021-11-11 | Disposition: A | Discharge status: Home / Self Care | Payer: BC Managed Care – PPO | Attending: Emergency Medicine | Admitting: Emergency Medicine

## 2021-11-11 DIAGNOSIS — R220 Localized swelling, mass and lump, head: Secondary | ICD-10-CM | POA: Diagnosis not present

## 2021-11-11 DIAGNOSIS — K0889 Other specified disorders of teeth and supporting structures: Secondary | ICD-10-CM | POA: Diagnosis not present

## 2021-11-11 DIAGNOSIS — K047 Periapical abscess without sinus: Secondary | ICD-10-CM | POA: Diagnosis not present

## 2021-11-11 MED ORDER — AMOXICILLIN 400 MG/5ML PO SUSR: 400.0000 mg | Freq: Three times a day (TID) | ORAL | 0 refills | Status: AC

## 2021-11-11 MED ORDER — PREDNISONE 10 MG (21) PO TBPK: ORAL_TABLET | Freq: Every day | ORAL | 0 refills | Status: DC

## 2021-11-11 NOTE — ED Triage Notes (Signed)
Pt c/o swelling along lower right jaw. Pt states that she needs to have 2 teeth removed and is unsure if her jaw has an absess. Pt has taken motrin this morning around 8/9am. Pt states that the pain started on Saturday.

## 2021-11-11 NOTE — ED Provider Notes (Signed)
[facility]    CSN: [id number] Arrival date & time: 11/11/21  1039      History   Chief Complaint Chief Complaint  Patient presents with   Abscess   Dental Pain    HPI Meagan Gordon is a 56 y.o. female.   Patient is here for right sided facial swelling and tooth pain since Saturday.  She states that she has seen her dentist for this broken tooth a few weeks ago they shifted the tooth down but is not able to pull.  Patient was referred to an oral surgeon and they are not open today for her to see them.  Taking Tylenol and Motrin with very minimal relief pain.  Does have some slight right ear pain.   Past Medical History:  Diagnosis Date   Abnormal uterine bleeding 06/13/2015   Diabetes mellitus without complication (HCC)    GERD (gastroesophageal reflux disease)    Hypertension    PCOS (polycystic ovarian syndrome)     Patient Active Problem List   Diagnosis Date Noted   Mixed hyperlipidemia 10/01/2021   Acute bacterial rhinosinusitis 03/25/2021   Neck muscle spasm 11/08/2018   Tendonitis 07/09/2018   Plantar fasciitis of left foot 07/09/2018   PCOS (polycystic ovarian syndrome) 05/28/2018   Gastroesophageal reflux disease without esophagitis 05/28/2018   Abdominal pain, RUQ 05/28/2018   Breast mass, right 07/19/2016   Prediabetes 06/17/2015    Past Surgical History:  Procedure Laterality Date   BREAST BIOPSY Left 09/11/2021   stereo biopsy/ coil clip/ path pending   CESAREAN SECTION  2000   LEEP  1996    OB History     Gravida  2   Para  2   Term      Preterm      AB      Living         SAB      IAB      Ectopic      Multiple      Live Births           Obstetric Comments  1st Menstrual Cycle:  17  1st Pregnancy:  19           Home Medications    Prior to Admission medications   Medication Sig Start Date End Date Taking? Authorizing Provider  ALPRAZolam (XANAX) 0.25 MG tablet Take 1 tablet (0.25 mg total) by mouth  daily as needed for anxiety (related to dental procedures). 06/21/20  Yes Reubin Milan, MD  amoxicillin (AMOXIL) 400 MG/5ML suspension Take 5 mLs (400 mg total) by mouth 3 (three) times daily for 7 days. 11/11/21 11/18/21 Yes Coralyn Mark, NP  cyclobenzaprine (FLEXERIL) 10 MG tablet TAKE 1 TABLET BY MOUTH EVERYDAY AT BEDTIME 08/05/21  Yes Reubin Milan, MD  dicyclomine (BENTYL) 10 MG capsule TAKE 1 CAPSULE (10 MG TOTAL) BY MOUTH 3 (THREE) TIMES DAILY BEFORE MEALS. 03/26/21  Yes Reubin Milan, MD  levonorgestrel [facility]) 20 MCG/24HR IUD by Intrauterine route. 12/31/15  Yes [provider]  metFORMIN (GLUCOPHAGE) 500 MG tablet Take 500 mg by mouth daily with breakfast.    Yes [provider]  omeprazole (PRILOSEC) 20 MG capsule TAKE 1 CAPSULE (20 MG TOTAL) BY MOUTH 2 (TWO) TIMES DAILY BEFORE A MEAL. 03/26/21 03/26/22 Yes Reubin Milan, MD  ondansetron (ZOFRAN) 4 MG tablet Take 1 tablet (4 mg total) by mouth every 8 (eight) hours as needed for nausea or vomiting. 09/19/21  Yes Burnette,  Clearnce Sorrel, PA-C  predniSONE (STERAPRED UNI-PAK 21 TAB) 10 MG (21) TBPK tablet Take by mouth daily. Take 6 tabs by mouth daily  for 2 days, then 5 tabs for 2 days, then 4 tabs for 2 days, then 3 tabs for 2 days, 2 tabs for 2 days, then 1 tab by mouth daily for 2 days 11/11/21  Yes Marney Setting, NP  spironolactone (ALDACTONE) 50 MG tablet TAKE 1 TABLET BY MOUTH TWICE A DAY 06/26/21  Yes Glean Hess, MD    Family History Family History  Problem Relation Age of Onset   Stroke Mother    Uterine cancer Mother 34   Dementia Father    Breast cancer Neg Hx     Social History Social History   Tobacco Use   Smoking status: Never   Smokeless tobacco: Never  Vaping Use   Vaping Use: Never used  Substance Use Topics   Alcohol use: Yes   Drug use: No     Allergies   Patient has no known allergies.   Review of Systems Review of Systems  Constitutional:  Negative for  chills and fever.  HENT:  Positive for dental problem and facial swelling.   Respiratory: Negative.    Cardiovascular: Negative.   Gastrointestinal: Negative.   Skin:        Swelling to right side of face  Neurological: Negative.     Physical Exam Triage Vital Signs ED Triage Vitals  Enc Vitals Group     BP 11/11/21 1126 (!) 147/84     Pulse Rate 11/11/21 1126 93     Resp 11/11/21 1126 18     Temp 11/11/21 1126 97.9 F (36.6 C)     Temp Source 11/11/21 1126 Oral     SpO2 11/11/21 1126 98 %     Weight 11/11/21 1124 153 lb (69.4 kg)     Height 11/11/21 1124 5\' 2"  (1.575 m)     Head Circumference --      Peak Flow --      Pain Score 11/11/21 1124 3     Pain Loc --      Pain Edu? --      Excl. in [place]? --    No data found.  Updated Vital Signs BP (!) 147/84 (BP Location: Left Arm)    Pulse 93    Temp 97.9 F (36.6 C) (Oral)    Resp 18    Ht 5\' 2"  (1.575 m)    Wt 153 lb (69.4 kg)    SpO2 98%    BMI 27.98 kg/m   Visual Acuity Right Eye Distance:   Left Eye Distance:   Bilateral Distance:    Right Eye Near:   Left Eye Near:    Bilateral Near:     Physical Exam Constitutional:      General: She is in acute distress.  HENT:     Right Ear: Tympanic membrane normal.     Left Ear: Tympanic membrane normal.     Nose: Nose normal.     Mouth/Throat:     Mouth: Mucous membranes are moist.     Dentition: Dental tenderness, dental caries and dental abscesses present.     Pharynx: Oropharynx is clear. Uvula midline.      Comments: Moderate size pleural abscess to back right tooth Eyes:     Pupils: Pupils are equal, round, and reactive to light.  Cardiovascular:     Rate and Rhythm: Normal rate.  Pulmonary:  Effort: Pulmonary effort is normal.  Abdominal:     General: Abdomen is flat.  Musculoskeletal:        General: Normal range of motion.  Skin:    Comments: Pulm is 1 edema noted to right side of face  Neurological:     Mental Status: She is alert.     UC  Treatments / Results  Labs (all labs ordered are listed, but only abnormal results are displayed) Labs Reviewed - No data to display  EKG   Radiology No results found.  Procedures Procedures (including critical care time)  Medications Ordered in UC Medications - No data to display  Initial Impression / Assessment and Plan / UC Course  I have reviewed the triage vital signs and the nursing notes.  Pertinent labs & imaging results that were available during my care of the patient were reviewed by me and considered in my medical decision making (see chart for details).     Will start on antibiotics patient is requesting liquid since it is hard for her to swallow the big Amoxil pills. She currently has an oral surgeon to pull the tooth she will contact them tomorrow when they reopen. Continue to take the Tylenol and Motrin for pain she can also use Orajel in the area. Can use a warm salt water gargle to help also. Final Clinical Impressions(s) / UC Diagnoses   Final diagnoses:  Dental abscess  Pain, dental  Swelling of right side of face   Discharge Instructions   None    ED Prescriptions     Medication Sig Dispense Auth. Provider   amoxicillin (AMOXIL) 400 MG/5ML suspension Take 5 mLs (400 mg total) by mouth 3 (three) times daily for 7 days. 100 mL Morley Kos L, NP   predniSONE (STERAPRED UNI-PAK 21 TAB) 10 MG (21) TBPK tablet Take by mouth daily. Take 6 tabs by mouth daily  for 2 days, then 5 tabs for 2 days, then 4 tabs for 2 days, then 3 tabs for 2 days, 2 tabs for 2 days, then 1 tab by mouth daily for 2 days 42 tablet Marney Setting, NP      PDMP not reviewed this encounter.   Marney Setting, NP 11/11/21 [phone]

## 2021-12-23 ENCOUNTER — Ambulatory Visit: Payer: Self-pay

## 2021-12-23 NOTE — Telephone Encounter (Signed)
°  Chief Complaint: HTN Symptoms: BP 175/110, headache Frequency: today Pertinent Negatives: Patient denies chest pain or blurred vision Disposition: [] ED /[] Urgent Care (no appt availability in office) / [x] Appointment(In office/virtual)/ []  [place] [facility]/ [] Home Care/ [] Refused Recommended Disposition /[] [place] [facility]/ []  Follow-up with PCP Additional Notes: Pt states she went for consult at dental surgery and BP was checked, BP 175/110.    Reason for Disposition  Systolic BP  >= 99991111 OR Diastolic >= A999333  Answer Assessment - Initial Assessment Questions 1. BLOOD PRESSURE: "What is the blood pressure?" "Did you take at least two measurements 5 minutes apart?"     175/110 2. ONSET: "When did you take your blood pressure?"     1 hr ago 3. HOW: "How did you obtain the blood pressure?" (e.g., visiting nurse, automatic home BP monitor)     automatic 4. HISTORY: "Do you have a history of high blood pressure?"     yes 5. MEDICATIONS: "Are you taking any medications for blood pressure?" "Have you missed any doses recently?"     Yes and no missed doses 6. OTHER SYMPTOMS: "Do you have any symptoms?" (e.g., headache, chest pain, blurred vision, difficulty breathing, weakness)     Headache  Protocols used: Blood Pressure - High-A-AH

## 2021-12-24 ENCOUNTER — Other Ambulatory Visit: Payer: Self-pay

## 2021-12-24 ENCOUNTER — Encounter: Payer: Self-pay | Admitting: Internal Medicine

## 2021-12-24 ENCOUNTER — Ambulatory Visit: Payer: BC Managed Care – PPO | Admitting: Internal Medicine

## 2021-12-24 VITALS — BP 148/86 | HR 90 | Ht 62.0 in | Wt 151.0 lb

## 2021-12-24 DIAGNOSIS — I10 Essential (primary) hypertension: Secondary | ICD-10-CM | POA: Insufficient documentation

## 2021-12-24 DIAGNOSIS — M5441 Lumbago with sciatica, right side: Secondary | ICD-10-CM

## 2021-12-24 DIAGNOSIS — R7303 Prediabetes: Secondary | ICD-10-CM

## 2021-12-24 LAB — POCT GLYCOSYLATED HEMOGLOBIN (HGB A1C): Hemoglobin A1C: 8.1 % — AB (ref 4.0–5.6)

## 2021-12-24 MED ORDER — MELOXICAM 15 MG PO TABS: 15.0000 mg | ORAL_TABLET | Freq: Every day | ORAL | 0 refills | Status: DC

## 2021-12-24 MED ORDER — LISINOPRIL 10 MG PO TABS: 10.0000 mg | ORAL_TABLET | Freq: Every day | ORAL | 1 refills | Status: DC

## 2021-12-24 MED ORDER — METFORMIN HCL 500 MG PO TABS: 500.0000 mg | ORAL_TABLET | Freq: Two times a day (BID) | ORAL | 1 refills | Status: DC

## 2021-12-24 NOTE — Progress Notes (Signed)
Date:  12/24/2021   Name:  Meagan Gordon   DOB:  01/21/1966   MRN:  [id number]   Chief Complaint: Hypertension and Diabetes  Diabetes She presents for her follow-up diabetic visit. She has type 2 (noted last visit) diabetes mellitus. Her disease course has been improving. Pertinent negatives for hypoglycemia include no dizziness or headaches. Pertinent negatives for diabetes include no chest pain, no fatigue and no weakness. Current diabetic treatment includes oral agent (monotherapy) (dose increased). An ACE inhibitor/angiotensin II receptor blocker is not being taken.  Hypertension This is a new problem. The problem has been gradually worsening since onset. The problem is uncontrolled. Pertinent negatives include no chest pain, headaches, palpitations or shortness of breath. Past treatments include diuretics (for PCOS).   Lab Results  Component Value Date   NA 137 08/02/2021   K 4.4 08/02/2021   CO2 23 08/02/2021   GLUCOSE 207 (H) 08/02/2021   BUN 9 08/02/2021   CREATININE 0.76 08/02/2021   CALCIUM 9.7 08/02/2021   EGFR 94 08/02/2021   GFRNONAA >60 07/31/2020   Lab Results  Component Value Date   CHOL 202 (H) 08/02/2021   HDL 43 08/02/2021   LDLCALC 139 (H) 08/02/2021   TRIG 113 08/02/2021   CHOLHDL 4.7 (H) 08/02/2021   Lab Results  Component Value Date   TSH 2.320 08/02/2021   Lab Results  Component Value Date   HGBA1C 8.3 (H) 08/02/2021   Lab Results  Component Value Date   WBC 8.4 08/02/2021   HGB 14.2 08/02/2021   HCT 42.3 08/02/2021   MCV 90 08/02/2021   PLT 347 08/02/2021   Lab Results  Component Value Date   ALT 17 08/02/2021   AST 16 08/02/2021   ALKPHOS 127 (H) 08/02/2021   BILITOT 0.3 08/02/2021   No results found for: 25OHVITD2, 25OHVITD3, VD25OH   Review of Systems  Constitutional:  Negative for fatigue and unexpected weight change.  HENT:  Negative for nosebleeds.   Eyes:  Negative for visual disturbance.  Respiratory:  Negative for cough,  chest tightness, shortness of breath and wheezing.   Cardiovascular:  Negative for chest pain, palpitations and leg swelling.  Gastrointestinal:  Negative for abdominal pain, constipation and diarrhea.  Neurological:  Negative for dizziness, weakness, light-headedness and headaches.   Patient Active Problem List   Diagnosis Date Noted   Essential hypertension 12/24/2021   Mixed hyperlipidemia 10/01/2021   Acute bacterial rhinosinusitis 03/25/2021   Neck muscle spasm 11/08/2018   Tendonitis 07/09/2018   Plantar fasciitis of left foot 07/09/2018   PCOS (polycystic ovarian syndrome) 05/28/2018   Gastroesophageal reflux disease without esophagitis 05/28/2018   Abdominal pain, RUQ 05/28/2018   Breast mass, right 07/19/2016   Prediabetes 06/17/2015    No Known Allergies  Past Surgical History:  Procedure Laterality Date   BREAST BIOPSY Left 09/11/2021   stereo biopsy/ coil clip/ path pending   CESAREAN SECTION  2000   LEEP  1996    Social History   Tobacco Use   Smoking status: Never   Smokeless tobacco: Never  Vaping Use   Vaping Use: Never used  Substance Use Topics   Alcohol use: Yes   Drug use: No     Medication list has been reviewed and updated.  Current Meds  Medication Sig   ALPRAZolam (XANAX) 0.25 MG tablet Take 1 tablet (0.25 mg total) by mouth daily as needed for anxiety (related to dental procedures).   dicyclomine (BENTYL) 10 MG capsule TAKE 1  CAPSULE (10 MG TOTAL) BY MOUTH 3 (THREE) TIMES DAILY BEFORE MEALS. (Patient taking differently: Take 10 mg by mouth in the morning and at bedtime.)   levonorgestrel (MIRENA) 20 MCG/24HR IUD by Intrauterine route.   meloxicam (MOBIC) 15 MG tablet Take 1 tablet by mouth daily.   metFORMIN (GLUCOPHAGE) 500 MG tablet Take 500 mg by mouth daily with breakfast.    omeprazole (PRILOSEC) 20 MG capsule TAKE 1 CAPSULE (20 MG TOTAL) BY MOUTH 2 (TWO) TIMES DAILY BEFORE A MEAL.   ondansetron (ZOFRAN) 4 MG tablet Take 1 tablet (4  mg total) by mouth every 8 (eight) hours as needed for nausea or vomiting.   spironolactone (ALDACTONE) 50 MG tablet TAKE 1 TABLET BY MOUTH TWICE A DAY   [DISCONTINUED] cyclobenzaprine (FLEXERIL) 10 MG tablet TAKE 1 TABLET BY MOUTH EVERYDAY AT BEDTIME   [DISCONTINUED] predniSONE (STERAPRED UNI-PAK 21 TAB) 10 MG (21) TBPK tablet Take by mouth daily. Take 6 tabs by mouth daily  for 2 days, then 5 tabs for 2 days, then 4 tabs for 2 days, then 3 tabs for 2 days, 2 tabs for 2 days, then 1 tab by mouth daily for 2 days    PHQ 2/9 Scores 12/24/2021 08/02/2021 03/25/2021 12/18/2020  PHQ - 2 Score '2 1 2 ' 0  PHQ- 9 Score '8 8 11 ' 0    GAD 7 : Generalized Anxiety Score 12/24/2021 08/02/2021 03/25/2021 12/18/2020  Nervous, Anxious, on Edge '1 1 1 ' 0  Control/stop worrying '1 1 1 ' 0  Worry too much - different things '1 1 1 ' 0  Trouble relaxing '1 1 1 ' 0  Restless '1 1 1 ' 0  Easily annoyed or irritable 1 0 0 0  Afraid - awful might happen 2 1 0 0  Total GAD 7 Score '8 6 5 ' 0  Anxiety Difficulty - Somewhat difficult Somewhat difficult Not difficult at all    BP Readings from Last 3 Encounters:  12/24/21 (!) 148/86  11/11/21 (!) 147/84  08/02/21 (!) 146/78    Physical Exam Vitals and nursing note reviewed.  Constitutional:      General: She is not in acute distress.    Appearance: Normal appearance. She is well-developed.  HENT:     Head: Normocephalic and atraumatic.      Comments: O.5 cm mobile moderately firm non tender mass Cardiovascular:     Rate and Rhythm: Normal rate and regular rhythm.     Pulses: Normal pulses.     Heart sounds: No murmur heard. Pulmonary:     Effort: Pulmonary effort is normal. No respiratory distress.     Breath sounds: No wheezing or rhonchi.  Musculoskeletal:     Cervical back: Normal range of motion.     Right lower leg: No edema.     Left lower leg: No edema.  Lymphadenopathy:     Cervical: No cervical adenopathy.  Skin:    General: Skin is warm and dry.     Findings:  No rash.  Neurological:     Mental Status: She is alert and oriented to person, place, and time.  Psychiatric:        Mood and Affect: Mood normal.        Behavior: Behavior normal.    Wt Readings from Last 3 Encounters:  12/24/21 151 lb (68.5 kg)  11/11/21 153 lb (69.4 kg)  08/02/21 154 lb 3.2 oz (69.9 kg)    BP (!) 148/86    Pulse 90    Ht '5\' 2"'  (  1.575 m)    Wt 151 lb (68.5 kg)    SpO2 97%    BMI 27.62 kg/m   Assessment and Plan: 1. Essential hypertension BP not controlled on spironolactone alone. Will add Lisinopril.  DASH diet. Recheck in one month - lisinopril (ZESTRIL) 10 MG tablet; Take 1 tablet (10 mg total) by mouth daily.  Dispense: 90 tablet; Refill: 1  2. Prediabetes Now outright DM.  Improved slightly with increase in metformin to bid along with some diet changes. Continue same regimen; work on diet changes and exercise Eye exam needed. - POCT glycosylated hemoglobin (Hb A1C)=8.1 down from 8.5 - Microalbumin / creatinine urine ratio - metFORMIN (GLUCOPHAGE) 500 MG tablet; Take 1 tablet (500 mg total) by mouth 2 (two) times daily with a meal.  Dispense: 180 tablet; Refill: 1  3. Acute midline low back pain with right-sided sciatica Intermittent symptoms relieved by Mobic PRN - meloxicam (MOBIC) 15 MG tablet; Take 1 tablet (15 mg total) by mouth daily.  Dispense: 90 tablet; Refill: 0   Partially dictated using Editor, commissioning. Any errors are unintentional.  Halina Maidens, MD [facility]  12/24/2021

## 2021-12-25 LAB — MICROALBUMIN / CREATININE URINE RATIO
Creatinine, Urine: 33.9 mg/dL
Microalb/Creat Ratio: <9 mg/g creat (ref 0–29)
Microalbumin, Urine: <3 ug/mL

## 2022-01-29 ENCOUNTER — Other Ambulatory Visit (HOSPITAL_COMMUNITY)
Admission: RE | Admit: 2022-01-29 | Discharge: 2022-01-29 | Disposition: A | Discharge status: Home / Self Care | Payer: BC Managed Care – PPO | Source: Ambulatory Visit | Attending: Advanced Practice Midwife | Admitting: Advanced Practice Midwife

## 2022-01-29 ENCOUNTER — Ambulatory Visit: Payer: BC Managed Care – PPO | Admitting: Advanced Practice Midwife

## 2022-01-29 ENCOUNTER — Other Ambulatory Visit: Payer: Self-pay

## 2022-01-29 ENCOUNTER — Encounter: Payer: Self-pay | Admitting: Advanced Practice Midwife

## 2022-01-29 VITALS — BP 136/83 | HR 94 | Ht 62.0 in | Wt 149.0 lb

## 2022-01-29 DIAGNOSIS — Z124 Encounter for screening for malignant neoplasm of cervix: Secondary | ICD-10-CM | POA: Diagnosis not present

## 2022-01-29 DIAGNOSIS — Z30432 Encounter for removal of intrauterine contraceptive device: Secondary | ICD-10-CM | POA: Diagnosis not present

## 2022-01-29 NOTE — Progress Notes (Signed)
? ?Patient ID: Veronika Heard, female   DOB: Oct 26, 1966, 56 y.o.   MRN: [id number] ? ?Reason for Visit: Contraception (Removal) ? ?GYN procedure note ?Subjective:  ?HPI: ? ?Skyylar Kopf is a 56 y.o. female being seen for IUD removal. We had discussed this at her last visit and due to reproductive hormone levels she is likely post-menopausal. She is uncertain of the last time she had any bleeding and reports it would have been a spot only and possibly 6 months to 1 year ago. Reviewed precautions- what symptoms to return for. Her last PAP was normal 5 years ago. She accepts PAP today. ? ?Past Medical History:  ?Diagnosis Date  ? Abnormal uterine bleeding 06/13/2015  ? Diabetes mellitus without complication (HCC)   ? GERD (gastroesophageal reflux disease)   ? Hypertension   ? PCOS (polycystic ovarian syndrome)   ? ?Family History  ?Problem Relation Age of Onset  ? Stroke Mother   ? Uterine cancer Mother 36  ? Dementia Father   ? Breast cancer Neg Hx   ? ?Past Surgical History:  ?Procedure Laterality Date  ? BREAST BIOPSY Left 09/11/2021  ? stereo biopsy/ coil clip/ path pending  ? CESAREAN SECTION  2000  ? LEEP  1996  ? ? ?Short Social History:  ?Social History  ? ?Tobacco Use  ? Smoking status: Never  ? Smokeless tobacco: Never  ?Substance Use Topics  ? Alcohol use: Yes  ? ? ?No Known Allergies ? ?Current Outpatient Medications  ?Medication Sig Dispense Refill  ? dicyclomine (BENTYL) 10 MG capsule TAKE 1 CAPSULE (10 MG TOTAL) BY MOUTH 3 (THREE) TIMES DAILY BEFORE MEALS. (Patient taking differently: Take 10 mg by mouth in the morning and at bedtime.) 270 capsule 1  ? levonorgestrel (MIRENA) 20 MCG/24HR IUD by Intrauterine route.    ? lisinopril (ZESTRIL) 10 MG tablet Take 1 tablet (10 mg total) by mouth daily. 90 tablet 1  ? meloxicam (MOBIC) 15 MG tablet Take 1 tablet (15 mg total) by mouth daily. 90 tablet 0  ? metFORMIN (GLUCOPHAGE) 500 MG tablet Take 1 tablet (500 mg total) by mouth 2 (two) times daily with a meal. 180  tablet 1  ? omeprazole (PRILOSEC) 20 MG capsule TAKE 1 CAPSULE (20 MG TOTAL) BY MOUTH 2 (TWO) TIMES DAILY BEFORE A MEAL. 180 capsule 1  ? spironolactone (ALDACTONE) 50 MG tablet TAKE 1 TABLET BY MOUTH TWICE A DAY 180 tablet 0  ? ALPRAZolam (XANAX) 0.25 MG tablet Take 1 tablet (0.25 mg total) by mouth daily as needed for anxiety (related to dental procedures). (Patient not taking: Reported on 01/29/2022) 5 tablet 0  ? ?No current facility-administered medications for this visit.  ? ? ?Review of Systems  ?Constitutional:  Negative for chills and fever.  ?HENT:  Negative for congestion, ear discharge, ear pain, hearing loss, sinus pain and sore throat.   ?Eyes:  Negative for blurred vision and double vision.  ?Respiratory:  Negative for cough, shortness of breath and wheezing.   ?Cardiovascular:  Negative for chest pain, palpitations and leg swelling.  ?Gastrointestinal:  Negative for abdominal pain, blood in stool, constipation, diarrhea, heartburn, melena, nausea and vomiting.  ?Genitourinary:  Negative for dysuria, flank pain, frequency, hematuria and urgency.  ?Musculoskeletal:  Negative for back pain, joint pain and myalgias.  ?Skin:  Negative for itching and rash.  ?Neurological:  Negative for dizziness, tingling, tremors, sensory change, speech change, focal weakness, seizures, loss of consciousness, weakness and headaches.  ?Endo/Heme/Allergies:  Negative for environmental allergies.  Does not bruise/bleed easily.  ?Psychiatric/Behavioral:  Negative for depression, hallucinations, memory loss, substance abuse and suicidal ideas. The patient is not nervous/anxious and does not have insomnia.   ? ? ?   ?Objective:  ?Objective  ? ?Vitals:  ? 01/29/22 1053  ?BP: 136/83  ?Pulse: 94  ?Weight: 149 lb (67.6 kg)  ?Height: 5\' 2"  (1.575 m)  ? ?Body mass index is 27.25 kg/m? ?Constitutional: Well nourished, well developed female in no acute distress.  ?HEENT: normal ?Skin: Warm and dry.   ?Respiratory:  Normal respiratory  effort ?Neuro: DTRs 2+, Cranial nerves grossly intact ?Psych: Alert and Oriented x3. No memory deficits. Mildly anxious due to IUD removal.  ? ? ?Pelvic exam: (female chaperone present) ?is not limited by body habitus ?EGBUS: within normal limits ?Vagina: within normal limits and with normal mucosa ?Cervix: normal appearance, PAP smear collected, IUD strings visualized at approximately 3 cm, and easily grasped with ring forceps. IUD successfully removed in its entirety. Scant cervical bleeding following removal. Patient tolerated procedure well. ? ? ? ?Assessment/Plan:  ?  ? ?56 y.o. G2 P2 post menopausal female s/p successful IUD removal and PAP smear ? ?PAP collected, follow up as needed after lab results ?Return to office PRN for gyn concerns ? ? ?40 CNM ?[facility] ?[facility] ?01/29/2022, 11:20 AM ? ? ?

## 2022-01-31 ENCOUNTER — Ambulatory Visit: Payer: BC Managed Care – PPO | Admitting: Internal Medicine

## 2022-02-03 LAB — CYTOLOGY - PAP
Comment: NEGATIVE
Diagnosis: NEGATIVE
Diagnosis: REACTIVE
High risk HPV: NEGATIVE

## 2022-03-22 ENCOUNTER — Other Ambulatory Visit: Payer: Self-pay | Admitting: Internal Medicine

## 2022-03-22 DIAGNOSIS — M5441 Lumbago with sciatica, right side: Secondary | ICD-10-CM

## 2022-03-24 NOTE — Telephone Encounter (Signed)
Requested Prescriptions  ?Pending Prescriptions Disp Refills  ?? meloxicam (MOBIC) 15 MG tablet [Pharmacy Med Name: MELOXICAM 15 MG TABLET] 90 tablet 0  ?  Sig: TAKE 1 TABLET (15 MG TOTAL) BY MOUTH DAILY.  ?  ? Analgesics:  COX2 Inhibitors Failed - 03/22/2022  9:18 AM  ?  ?  Failed - Manual Review: Labs are only required if the patient has taken medication for more than 8 weeks.  ?  ?  Passed - HGB in normal range and within 360 days  ?  Hemoglobin  ?Date Value Ref Range Status  ?08/02/2021 14.2 11.1 - 15.9 g/dL Final  ?   ?  ?  Passed - Cr in normal range and within 360 days  ?  Creatinine, Ser  ?Date Value Ref Range Status  ?08/02/2021 0.76 0.57 - 1.00 mg/dL Final  ?   ?  ?  Passed - HCT in normal range and within 360 days  ?  Hematocrit  ?Date Value Ref Range Status  ?08/02/2021 42.3 34.0 - 46.6 % Final  ?   ?  ?  Passed - AST in normal range and within 360 days  ?  AST  ?Date Value Ref Range Status  ?08/02/2021 16 0 - 40 IU/L Final  ?   ?  ?  Passed - ALT in normal range and within 360 days  ?  ALT  ?Date Value Ref Range Status  ?08/02/2021 17 0 - 32 IU/L Final  ?   ?  ?  Passed - eGFR is 30 or above and within 360 days  ?  GFR calc Af Amer  ?Date Value Ref Range Status  ?07/31/2020 >60 >60 mL/min Final  ? ?GFR calc non Af Amer  ?Date Value Ref Range Status  ?07/31/2020 >60 >60 mL/min Final  ? ?eGFR  ?Date Value Ref Range Status  ?08/02/2021 94 >59 mL/min/1.73 Final  ?   ?  ?  Passed - Patient is not pregnant  ?  ?  Passed - Valid encounter within last 12 months  ?  Recent Outpatient Visits   ?      ? 3 months ago Essential hypertension  ? [facility] Glean Hess, MD  ? 7 months ago Annual physical exam  ? [facility] Glean Hess, MD  ? 12 months ago Acute bacterial rhinosinusitis  ? [facility] Montel Culver, MD  ? 1 year ago Acute midline low back pain with right-sided sciatica  ? [facility] Glean Hess, MD  ? 1 year ago Annual physical exam  ?  [facility] Glean Hess, MD  ?  ?  ?Future Appointments   ?        ? In 1 week Glean Hess, MD [facility], [place]  ? In 4 months [employer] [facility] Jesse Sans, MD [facility], [place]  ?  ? ?  ?  ?  ? ? ?

## 2022-04-03 ENCOUNTER — Encounter: Payer: Self-pay | Admitting: Internal Medicine

## 2022-04-03 ENCOUNTER — Ambulatory Visit: Payer: BC Managed Care – PPO | Admitting: Internal Medicine

## 2022-04-03 VITALS — BP 126/78 | HR 86 | Ht 62.0 in | Wt 148.0 lb

## 2022-04-03 DIAGNOSIS — E118 Type 2 diabetes mellitus with unspecified complications: Secondary | ICD-10-CM | POA: Diagnosis not present

## 2022-04-03 DIAGNOSIS — I10 Essential (primary) hypertension: Secondary | ICD-10-CM

## 2022-04-03 DIAGNOSIS — E785 Hyperlipidemia, unspecified: Secondary | ICD-10-CM | POA: Diagnosis not present

## 2022-04-03 DIAGNOSIS — K219 Gastro-esophageal reflux disease without esophagitis: Secondary | ICD-10-CM

## 2022-04-03 DIAGNOSIS — R1011 Right upper quadrant pain: Secondary | ICD-10-CM | POA: Diagnosis not present

## 2022-04-03 DIAGNOSIS — E1169 Type 2 diabetes mellitus with other specified complication: Secondary | ICD-10-CM | POA: Diagnosis not present

## 2022-04-03 DIAGNOSIS — E282 Polycystic ovarian syndrome: Secondary | ICD-10-CM

## 2022-04-03 MED ORDER — METFORMIN HCL ER 500 MG PO TB24: 500.0000 mg | ORAL_TABLET | Freq: Every day | ORAL | 1 refills | Status: DC

## 2022-04-03 MED ORDER — DICYCLOMINE HCL 10 MG PO CAPS: 10.0000 mg | ORAL_CAPSULE | Freq: Three times a day (TID) | ORAL | 1 refills | Status: DC

## 2022-04-03 MED ORDER — OMEPRAZOLE 20 MG PO CPDR: 20.0000 mg | DELAYED_RELEASE_CAPSULE | Freq: Two times a day (BID) | ORAL | 1 refills | Status: DC

## 2022-04-03 NOTE — Progress Notes (Signed)
Date:  04/03/2022   Name:  Meagan Gordon   DOB:  Jul 05, 1966   MRN:  [id number]   Chief Complaint: Hypertension and Diabetes  Hypertension This is a chronic problem. The problem is controlled. Associated symptoms include headaches (occasional tension type relieved by Tylenol). Pertinent negatives include no chest pain, palpitations or shortness of breath. Past treatments include ACE inhibitors and diuretics (lisinopril added last visit). The current treatment provides significant improvement.  Diabetes She presents for her follow-up diabetic visit. She has type 2 diabetes mellitus. Her disease course has been improving. Hypoglycemia symptoms include headaches (occasional tension type relieved by Tylenol) and nervousness/anxiousness (more anxiety recently). Pertinent negatives for hypoglycemia include no tremors. Pertinent negatives for diabetes include no chest pain, no fatigue, no polydipsia and no polyuria. Current diabetic treatment includes oral agent (monotherapy) (metformin). She is following a generally healthy diet. An ACE inhibitor/angiotensin II receptor blocker is being taken. Eye exam is not current.  Hyperlipidemia This is a chronic problem. The problem is uncontrolled. Pertinent negatives include no chest pain or shortness of breath. Current antihyperlipidemic treatment includes diet change.  Hot flashes - have some hot flashes at night since removing IUD.  Not severe at this time. She does have PCOS and no recent US.  No pelvic pain or menstrual bleeding. Metformin 2 per day is causing diarrhea.  She has not been following a great diet.  Lab Results  Component Value Date   NA 137 08/02/2021   K 4.4 08/02/2021   CO2 23 08/02/2021   GLUCOSE 207 (H) 08/02/2021   BUN 9 08/02/2021   CREATININE 0.76 08/02/2021   CALCIUM 9.7 08/02/2021   EGFR 94 08/02/2021   GFRNONAA >60 07/31/2020   Lab Results  Component Value Date   CHOL 202 (H) 08/02/2021   HDL 43 08/02/2021   LDLCALC  139 (H) 08/02/2021   TRIG 113 08/02/2021   CHOLHDL 4.7 (H) 08/02/2021   Lab Results  Component Value Date   TSH 2.320 08/02/2021   Lab Results  Component Value Date   HGBA1C 8.1 (A) 12/24/2021   Lab Results  Component Value Date   WBC 8.4 08/02/2021   HGB 14.2 08/02/2021   HCT 42.3 08/02/2021   MCV 90 08/02/2021   PLT 347 08/02/2021   Lab Results  Component Value Date   ALT 17 08/02/2021   AST 16 08/02/2021   ALKPHOS 127 (H) 08/02/2021   BILITOT 0.3 08/02/2021   No results found for: 25OHVITD2, 25OHVITD3, VD25OH   Review of Systems  Constitutional:  Negative for appetite change, fatigue, fever and unexpected weight change.  HENT:  Negative for tinnitus and trouble swallowing.   Eyes:  Negative for visual disturbance.  Respiratory:  Negative for cough, chest tightness and shortness of breath.   Cardiovascular:  Negative for chest pain, palpitations and leg swelling.  Gastrointestinal:  Positive for diarrhea (from higher dose metformin). Negative for abdominal pain.  Endocrine: Negative for polydipsia and polyuria.  Genitourinary:  Negative for dysuria, hematuria, menstrual problem and pelvic pain.       Hot flashes at night  Musculoskeletal:  Negative for arthralgias.  Neurological:  Positive for headaches (occasional tension type relieved by Tylenol). Negative for tremors and numbness.  Psychiatric/Behavioral:  Positive for sleep disturbance. Negative for dysphoric mood. The patient is nervous/anxious (more anxiety recently).    Patient Active Problem List   Diagnosis Date Noted   Essential hypertension 12/24/2021   Hyperlipidemia associated with type 2 diabetes mellitus ([place]) 10/01/2021  Neck muscle spasm 11/08/2018   Tendonitis 07/09/2018   Plantar fasciitis of left foot 07/09/2018   PCOS (polycystic ovarian syndrome) 05/28/2018   Gastroesophageal reflux disease without esophagitis 05/28/2018   Abdominal pain, RUQ 05/28/2018   Breast mass, right 07/19/2016    Type II diabetes mellitus with complication ([place]) 24/46/9507    No Known Allergies  Past Surgical History:  Procedure Laterality Date   BREAST BIOPSY Left 09/11/2021   stereo biopsy/ coil clip/ path pending   CESAREAN SECTION  2000   LEEP  1996    Social History   Tobacco Use   Smoking status: Never   Smokeless tobacco: Never  Vaping Use   Vaping Use: Never used  Substance Use Topics   Alcohol use: Yes   Drug use: No     Medication list has been reviewed and updated.  Current Meds  Medication Sig   dicyclomine (BENTYL) 10 MG capsule TAKE 1 CAPSULE (10 MG TOTAL) BY MOUTH 3 (THREE) TIMES DAILY BEFORE MEALS. (Patient taking differently: Take 10 mg by mouth in the morning and at bedtime.)   lisinopril (ZESTRIL) 10 MG tablet Take 1 tablet (10 mg total) by mouth daily.   meloxicam (MOBIC) 15 MG tablet TAKE 1 TABLET (15 MG TOTAL) BY MOUTH DAILY.   metFORMIN (GLUCOPHAGE) 500 MG tablet Take 1 tablet (500 mg total) by mouth 2 (two) times daily with a meal.   spironolactone (ALDACTONE) 50 MG tablet TAKE 1 TABLET BY MOUTH TWICE A DAY   [DISCONTINUED] omeprazole (PRILOSEC) 20 MG capsule TAKE 1 CAPSULE (20 MG TOTAL) BY MOUTH 2 (TWO) TIMES DAILY BEFORE A MEAL.       04/03/2022   10:08 AM 12/24/2021    3:38 PM 08/02/2021    8:33 AM 03/25/2021    2:40 PM  GAD 7 : Generalized Anxiety Score  Nervous, Anxious, on Edge '2 1 1 1  ' Control/stop worrying '1 1 1 1  ' Worry too much - different things '2 1 1 1  ' Trouble relaxing '2 1 1 1  ' Restless '2 1 1 1  ' Easily annoyed or irritable 2 1 0 0  Afraid - awful might happen '1 2 1 ' 0  Total GAD 7 Score '12 8 6 5  ' Anxiety Difficulty Somewhat difficult  Somewhat difficult Somewhat difficult       04/03/2022   10:07 AM  Depression screen PHQ 2/9  Decreased Interest 1  Down, Depressed, Hopeless 1  PHQ - 2 Score 2  Altered sleeping 1  Tired, decreased energy 2  Change in appetite 2  Feeling bad or failure about yourself  0  Trouble concentrating 2   Moving slowly or fidgety/restless 2  Suicidal thoughts 0  PHQ-9 Score 11  Difficult doing work/chores Somewhat difficult    BP Readings from Last 3 Encounters:  04/03/22 126/78  01/29/22 136/83  12/24/21 (!) 148/86    Physical Exam Vitals and nursing note reviewed.  Constitutional:      General: She is not in acute distress.    Appearance: Normal appearance. She is well-developed.  HENT:     Head: Normocephalic and atraumatic.  Cardiovascular:     Rate and Rhythm: Normal rate and regular rhythm.     Pulses: Normal pulses.     Heart sounds: No murmur heard. Pulmonary:     Effort: Pulmonary effort is normal. No respiratory distress.     Breath sounds: No wheezing or rhonchi.  Abdominal:     General: Abdomen is flat.  Palpations: Abdomen is soft.     Tenderness: There is no abdominal tenderness. There is no guarding or rebound.  Musculoskeletal:     Cervical back: Normal range of motion.     Right lower leg: No edema.     Left lower leg: No edema.  Lymphadenopathy:     Cervical: No cervical adenopathy.  Skin:    General: Skin is warm and dry.     Capillary Refill: Capillary refill takes less than 2 seconds.     Findings: No rash.  Neurological:     General: No focal deficit present.     Mental Status: She is alert and oriented to person, place, and time.  Psychiatric:        Mood and Affect: Mood normal.        Behavior: Behavior normal.    Wt Readings from Last 3 Encounters:  04/03/22 148 lb (67.1 kg)  01/29/22 149 lb (67.6 kg)  12/24/21 151 lb (68.5 kg)    BP 126/78   Pulse 86   Ht '5\' 2"'  (1.575 m)   Wt 148 lb (67.1 kg)   LMP  (Exact Date)   SpO2 98%   BMI 27.07 kg/m   Assessment and Plan: 1. Type II diabetes mellitus with complication (HCC) Not tolerating metformin 2 per day.  Will decrease to one per day and change to ER formulation Add Rybelsus 3 mg. sample - Basic metabolic panel - Hemoglobin A1c - metFORMIN (GLUCOPHAGE-XR) 500 MG 24 hr  tablet; Take 1 tablet (500 mg total) by mouth daily with breakfast.  Dispense: 90 tablet; Refill: 1  2. Essential hypertension Clinically stable exam with well controlled BP. Tolerating medications without side effects at this time. Pt to continue current regimen and low sodium diet; benefits of regular exercise as able discussed.  3. Hyperlipidemia associated with type 2 diabetes mellitus ([place]) Not currently on medications - Lipid panel  4. Abdominal pain, RUQ Stable with intermittent symptoms  - dicyclomine (BENTYL) 10 MG capsule; Take 1 capsule (10 mg total) by mouth 3 (three) times daily before meals.  Dispense: 90 capsule; Refill: 1  5. Gastroesophageal reflux disease without esophagitis Symptoms well controlled on daily PPI No red flag signs such as weight loss, n/v, melena Will continue omeprazole - omeprazole (PRILOSEC) 20 MG capsule; Take 1 capsule (20 mg total) by mouth 2 (two) times daily before a meal.  Dispense: 180 capsule; Refill: 1  6. PCOS (polycystic ovarian syndrome) Has not had [place] recently - - US Pelvic Complete With Transvaginal; Future   Partially dictated using Editor, commissioning. Any errors are unintentional.  Halina Maidens, MD [facility]  04/03/2022

## 2022-04-04 ENCOUNTER — Other Ambulatory Visit: Payer: Self-pay

## 2022-04-04 DIAGNOSIS — E1169 Type 2 diabetes mellitus with other specified complication: Secondary | ICD-10-CM

## 2022-04-04 LAB — BASIC METABOLIC PANEL
BUN/Creatinine Ratio: 13 (ref 9–23)
BUN: 10 mg/dL (ref 6–24)
CO2: 23 mmol/L (ref 20–29)
Calcium: 10 mg/dL (ref 8.7–10.2)
Chloride: 97 mmol/L (ref 96–106)
Creatinine, Ser: 0.78 mg/dL (ref 0.57–1.00)
Glucose: 178 mg/dL — ABNORMAL HIGH (ref 70–99)
Potassium: 4.9 mmol/L (ref 3.5–5.2)
Sodium: 135 mmol/L (ref 134–144)
eGFR: 91 mL/min/{1.73_m2} (ref >59)

## 2022-04-04 LAB — LIPID PANEL
Chol/HDL Ratio: 4.5 ratio — ABNORMAL HIGH (ref 0.0–4.4)
Cholesterol, Total: 225 mg/dL — ABNORMAL HIGH (ref 100–199)
HDL: 50 mg/dL (ref >39)
LDL Chol Calc (NIH): 161 mg/dL — ABNORMAL HIGH (ref 0–99)
Triglycerides: 80 mg/dL (ref 0–149)
VLDL Cholesterol Cal: 14 mg/dL (ref 5–40)

## 2022-04-04 LAB — HEMOGLOBIN A1C
Est. average glucose Bld gHb Est-mCnc: 171 mg/dL
Hgb A1c MFr Bld: 7.6 % — ABNORMAL HIGH (ref 4.8–5.6)

## 2022-04-04 MED ORDER — ATORVASTATIN CALCIUM 10 MG PO TABS: 10.0000 mg | ORAL_TABLET | Freq: Every day | ORAL | 0 refills | Status: DC

## 2022-04-11 ENCOUNTER — Ambulatory Visit: Payer: BC Managed Care – PPO

## 2022-04-18 ENCOUNTER — Other Ambulatory Visit: Payer: BC Managed Care – PPO

## 2022-04-29 ENCOUNTER — Other Ambulatory Visit: Payer: Self-pay | Admitting: Internal Medicine

## 2022-04-29 DIAGNOSIS — R1011 Right upper quadrant pain: Secondary | ICD-10-CM

## 2022-04-29 NOTE — Telephone Encounter (Signed)
Requested Prescriptions  Pending Prescriptions Disp Refills  . dicyclomine (BENTYL) 10 MG capsule [Pharmacy Med Name: DICYCLOMINE 10 MG CAPSULE] 270 capsule 3    Sig: TAKE 1 CAPSULE (10 MG TOTAL) BY MOUTH 3 (THREE) TIMES DAILY BEFORE MEALS.     Gastroenterology:  Antispasmodic Agents Passed - 04/29/2022 11:30 AM      Passed - Valid encounter within last 12 months    Recent Outpatient Visits          3 weeks ago Type II diabetes mellitus with complication [facility])   [facility] Reubin Milan, MD   4 months ago Essential hypertension   [facility] Reubin Milan, MD   9 months ago Annual physical exam   [facility] Reubin Milan, MD   1 year ago Acute bacterial rhinosinusitis   [facility] Jerrol Banana, MD   1 year ago Acute midline low back pain with right-sided sciatica   [facility] Reubin Milan, MD      Future Appointments            In 3 months Judithann Graves Nyoka Cowden, MD [facility], [facility]

## 2022-06-24 ENCOUNTER — Other Ambulatory Visit: Payer: Self-pay | Admitting: Internal Medicine

## 2022-06-24 DIAGNOSIS — M5441 Lumbago with sciatica, right side: Secondary | ICD-10-CM

## 2022-06-24 DIAGNOSIS — I10 Essential (primary) hypertension: Secondary | ICD-10-CM

## 2022-06-24 NOTE — Telephone Encounter (Signed)
Requested Prescriptions  Pending Prescriptions Disp Refills  . meloxicam (MOBIC) 15 MG tablet [Pharmacy Med Name: MELOXICAM 15 MG TABLET] 90 tablet 0    Sig: TAKE 1 TABLET (15 MG TOTAL) BY MOUTH DAILY.     Analgesics:  COX2 Inhibitors Failed - 06/24/2022  2:19 AM      Failed - Manual Review: Labs are only required if the patient has taken medication for more than 8 weeks.      Passed - HGB in normal range and within 360 days    Hemoglobin  Date Value Ref Range Status  08/02/2021 14.2 11.1 - 15.9 g/dL Final         Passed - Cr in normal range and within 360 days    Creatinine, Ser  Date Value Ref Range Status  04/03/2022 0.78 0.57 - 1.00 mg/dL Final         Passed - HCT in normal range and within 360 days    Hematocrit  Date Value Ref Range Status  08/02/2021 42.3 34.0 - 46.6 % Final         Passed - AST in normal range and within 360 days    AST  Date Value Ref Range Status  08/02/2021 16 0 - 40 IU/L Final         Passed - ALT in normal range and within 360 days    ALT  Date Value Ref Range Status  08/02/2021 17 0 - 32 IU/L Final         Passed - eGFR is 30 or above and within 360 days    GFR calc Af Amer  Date Value Ref Range Status  07/31/2020 >60 >60 mL/min Final   GFR calc non Af Amer  Date Value Ref Range Status  07/31/2020 >60 >60 mL/min Final   eGFR  Date Value Ref Range Status  04/03/2022 91 >59 mL/min/1.73 Final         Passed - Patient is not pregnant      Passed - Valid encounter within last 12 months    Recent Outpatient Visits          2 months ago Type II diabetes mellitus with complication [facility])   [facility] Glean Hess, MD   6 months ago Essential hypertension   [facility] Glean Hess, MD   10 months ago Annual physical exam   [facility] Glean Hess, MD   1 year ago Acute bacterial rhinosinusitis   [facility] Montel Culver, MD   1 year ago Acute midline low back pain with  right-sided sciatica   [facility] Glean Hess, MD      Future Appointments            In 1 month Army Melia Jesse Sans, MD [facility], [place]           . lisinopril (ZESTRIL) 10 MG tablet [Pharmacy Med Name: LISINOPRIL 10 MG TABLET] 90 tablet 1    Sig: TAKE 1 TABLET BY MOUTH EVERY DAY     Cardiovascular:  ACE Inhibitors Passed - 06/24/2022  2:19 AM      Passed - Cr in normal range and within 180 days    Creatinine, Ser  Date Value Ref Range Status  04/03/2022 0.78 0.57 - 1.00 mg/dL Final         Passed - K in normal range and within 180 days    Potassium  Date Value Ref Range  Status  04/03/2022 4.9 3.5 - 5.2 mmol/L Final         Passed - Patient is not pregnant      Passed - Last BP in normal range    BP Readings from Last 1 Encounters:  04/03/22 126/78         Passed - Valid encounter within last 6 months    Recent Outpatient Visits          2 months ago Type II diabetes mellitus with complication [facility])   [facility]   6 months ago Essential hypertension   [facility] Glean Hess, MD   10 months ago Annual physical exam   [facility], MD   1 year ago Acute bacterial rhinosinusitis   [facility] Montel Culver, MD   1 year ago Acute midline low back pain with right-sided sciatica   [facility] Glean Hess, MD      Future Appointments            In 1 month Army Melia Jesse Sans, MD [facility], [facility]

## 2022-07-01 LAB — HM DIABETES EYE EXAM

## 2022-07-03 ENCOUNTER — Other Ambulatory Visit: Payer: Self-pay | Admitting: Internal Medicine

## 2022-07-03 DIAGNOSIS — E1169 Type 2 diabetes mellitus with other specified complication: Secondary | ICD-10-CM

## 2022-07-03 NOTE — Telephone Encounter (Signed)
Requested Prescriptions  Pending Prescriptions Disp Refills  . atorvastatin (LIPITOR) 10 MG tablet [Pharmacy Med Name: ATORVASTATIN 10 MG TABLET] 90 tablet 0    Sig: TAKE 1 TABLET BY MOUTH EVERY DAY     Cardiovascular:  Antilipid - Statins Failed - 07/03/2022  1:55 AM      Failed - Lipid Panel in normal range within the last 12 months    Cholesterol, Total  Date Value Ref Range Status  04/03/2022 225 (H) 100 - 199 mg/dL Final   LDL Chol Calc (NIH)  Date Value Ref Range Status  04/03/2022 161 (H) 0 - 99 mg/dL Final   HDL  Date Value Ref Range Status  04/03/2022 50 >39 mg/dL Final   Triglycerides  Date Value Ref Range Status  04/03/2022 80 0 - 149 mg/dL Final         Passed - Patient is not pregnant      Passed - Valid encounter within last 12 months    Recent Outpatient Visits          3 months ago Type II diabetes mellitus with complication (HCC)   [place] [facility] at [facility], Nyoka Cowden, MD   6 months ago Essential hypertension   [place] [facility] at [facility], Nyoka Cowden, MD   11 months ago Annual physical exam   [place] [facility] at [facility], Nyoka Cowden, MD   1 year ago Acute bacterial rhinosinusitis   [place] [facility] at [facility], Ocie Bob, MD   1 year ago Acute midline low back pain with right-sided sciatica   [place] [facility] at [facility], Nyoka Cowden, MD      Future Appointments            In 1 month Judithann Graves, Nyoka Cowden, MD [facility] Primary Care and [facility] Medicine at [facility], [facility]

## 2022-08-06 ENCOUNTER — Encounter: Payer: Self-pay | Admitting: Internal Medicine

## 2022-08-06 ENCOUNTER — Ambulatory Visit (INDEPENDENT_AMBULATORY_CARE_PROVIDER_SITE_OTHER): Payer: BC Managed Care – PPO | Admitting: Internal Medicine

## 2022-08-06 VITALS — BP 120/78 | HR 89 | Ht 62.0 in | Wt 151.0 lb

## 2022-08-06 DIAGNOSIS — Z0000 Encounter for general adult medical examination without abnormal findings: Secondary | ICD-10-CM

## 2022-08-06 DIAGNOSIS — K219 Gastro-esophageal reflux disease without esophagitis: Secondary | ICD-10-CM | POA: Diagnosis not present

## 2022-08-06 DIAGNOSIS — I10 Essential (primary) hypertension: Secondary | ICD-10-CM

## 2022-08-06 DIAGNOSIS — E1169 Type 2 diabetes mellitus with other specified complication: Secondary | ICD-10-CM

## 2022-08-06 DIAGNOSIS — Z1231 Encounter for screening mammogram for malignant neoplasm of breast: Secondary | ICD-10-CM | POA: Diagnosis not present

## 2022-08-06 DIAGNOSIS — E785 Hyperlipidemia, unspecified: Secondary | ICD-10-CM

## 2022-08-06 DIAGNOSIS — E118 Type 2 diabetes mellitus with unspecified complications: Secondary | ICD-10-CM | POA: Diagnosis not present

## 2022-08-06 DIAGNOSIS — Z1211 Encounter for screening for malignant neoplasm of colon: Secondary | ICD-10-CM

## 2022-08-06 NOTE — Progress Notes (Signed)
Date:  08/06/2022   Name:  Meagan Gordon   DOB:  [birth year]   MRN:  [id number]   Chief Complaint: Annual Exam (Breast exam no pap ) Meagan Gordon is a 56 y.o. female who presents today for her Complete Annual Exam. She feels well. She reports exercising walking 2-3 days a week. She reports she is sleeping poorly. Breast complaints none.   Mammogram: 08/2021 DEXA: none Pap smear: 01/2022 neg/neg Colonoscopy: FIT 09/2021  Health Maintenance Due  Topic Date Due   OPHTHALMOLOGY EXAM  Never done   Zoster Vaccines- Shingrix (1 of 2) Never done   COLONOSCOPY (Pts 45-12yr Insurance coverage will need to be confirmed)  Never done    Immunization History  Administered Date(s) Administered   Influenza,inj,Quad PF,6+ Mos 09/28/2018   Influenza-Unspecified 09/30/2019   Tdap 05/28/2018    Diabetes She presents for her follow-up diabetic visit. She has type 2 diabetes mellitus. Her disease course has been stable. Pertinent negatives for hypoglycemia include no dizziness, headaches, nervousness/anxiousness or tremors. Pertinent negatives for diabetes include no chest pain, no fatigue, no polydipsia and no polyuria. Pertinent negatives for diabetic complications include no CVA. Current diabetic treatment includes oral agent (monotherapy) (metformin). An ACE inhibitor/angiotensin II receptor blocker is being taken. Eye exam is not current.  Hypertension This is a chronic problem. The problem is controlled. Pertinent negatives include no chest pain, headaches, palpitations or shortness of breath. Past treatments include ACE inhibitors and diuretics. The current treatment provides significant improvement. There is no history of kidney disease, CAD/MI or CVA.  Hyperlipidemia This is a chronic problem. The problem is uncontrolled. Pertinent negatives include no chest pain or shortness of breath. Treatments tried: statin started last visit.    Lab Results  Component Value Date   NA 135 04/03/2022   K  4.9 04/03/2022   CO2 23 04/03/2022   GLUCOSE 178 (H) 04/03/2022   BUN 10 04/03/2022   CREATININE 0.78 04/03/2022   CALCIUM 10.0 04/03/2022   EGFR 91 04/03/2022   GFRNONAA >60 07/31/2020   Lab Results  Component Value Date   CHOL 225 (H) 04/03/2022   HDL 50 04/03/2022   LDLCALC 161 (H) 04/03/2022   TRIG 80 04/03/2022   CHOLHDL 4.5 (H) 04/03/2022   Lab Results  Component Value Date   TSH 2.320 08/02/2021   Lab Results  Component Value Date   HGBA1C 7.6 (H) 04/03/2022   Lab Results  Component Value Date   WBC 8.4 08/02/2021   HGB 14.2 08/02/2021   HCT 42.3 08/02/2021   MCV 90 08/02/2021   PLT 347 08/02/2021   Lab Results  Component Value Date   ALT 17 08/02/2021   AST 16 08/02/2021   ALKPHOS 127 (H) 08/02/2021   BILITOT 0.3 08/02/2021   No results found for: "25OHVITD2", "25OHVITD3", "VD25OH"   Review of Systems  Constitutional:  Negative for chills, fatigue and fever.  HENT:  Negative for congestion, hearing loss, tinnitus, trouble swallowing and voice change.   Eyes:  Negative for visual disturbance.  Respiratory:  Negative for cough, chest tightness, shortness of breath and wheezing.   Cardiovascular:  Negative for chest pain, palpitations and leg swelling.  Gastrointestinal:  Negative for abdominal pain, constipation, diarrhea and vomiting.  Endocrine: Negative for polydipsia and polyuria.  Genitourinary:  Negative for dysuria, frequency, genital sores, vaginal bleeding and vaginal discharge.  Musculoskeletal:  Negative for arthralgias, gait problem and joint swelling.  Skin:  Negative for color change and rash.  Neurological:  Negative for dizziness, tremors, light-headedness and headaches.  Hematological:  Negative for adenopathy. Does not bruise/bleed easily.  Psychiatric/Behavioral:  Negative for dysphoric mood and sleep disturbance. The patient is not nervous/anxious.     Patient Active Problem List   Diagnosis Date Noted   Essential hypertension  12/24/2021   Hyperlipidemia associated with type 2 diabetes mellitus ([place]) 10/01/2021   Neck muscle spasm 11/08/2018   Tendonitis 07/09/2018   Plantar fasciitis of left foot 07/09/2018   PCOS (polycystic ovarian syndrome) 05/28/2018   Gastroesophageal reflux disease without esophagitis 05/28/2018   Abdominal pain, RUQ 05/28/2018   Breast mass, right 07/19/2016   Type II diabetes mellitus with complication ([place]) 87/68/1157    No Known Allergies  Past Surgical History:  Procedure Laterality Date   BREAST BIOPSY Left 09/11/2021   stereo biopsy/ coil clip/ path pending   CESAREAN SECTION  2000   LEEP  1996    Social History   Tobacco Use   Smoking status: Never   Smokeless tobacco: Never  Vaping Use   Vaping Use: Never used  Substance Use Topics   Alcohol use: Yes   Drug use: No     Medication list has been reviewed and updated.  No outpatient medications have been marked as taking for the 08/06/22 encounter (Office Visit) with Glean Hess, MD.       08/06/2022    8:34 AM 04/03/2022   10:08 AM 12/24/2021    3:38 PM 08/02/2021    8:33 AM  GAD 7 : Generalized Anxiety Score  Nervous, Anxious, on Edge _0 Control/stop worrying _1 Worry too much - different things _2 Trouble relaxing _3 Restless _4 Easily annoyed or irritable _5 0  Afraid - awful might happen _6 Total GAD 7 Score _7 Anxiety Difficulty Somewhat difficult Somewhat difficult  Somewhat difficult       08/06/2022    8:34 AM 04/03/2022   10:07 AM 12/24/2021    3:38 PM  Depression screen PHQ 2/9  Decreased Interest _8 Down, Depressed, Hopeless _9 PHQ - 2 Score _10 Altered sleeping _11 Tired, decreased energy _12 Change in appetite _13 Feeling bad or failure about yourself  1 0 0  Trouble concentrating _14 Moving slowly or fidgety/restless _15 Suicidal thoughts 0 0 0  PHQ-9 Score _16 Difficult doing work/chores  Somewhat difficult Somewhat difficult Not difficult at all    BP Readings from Last 3 Encounters:  08/06/22 120/78  04/03/22 126/78  01/29/22 136/83    Physical Exam Vitals and nursing note reviewed.  Constitutional:      General: She is not in acute distress.    Appearance: She is well-developed.  HENT:     Head: Normocephalic and atraumatic.     Right Ear: Tympanic membrane and ear canal normal.     Left Ear: Tympanic membrane and ear canal normal.     Nose:     Right Sinus: No maxillary sinus tenderness.     Left Sinus: No maxillary sinus tenderness.  Eyes:     General: No scleral icterus.       Right eye: No discharge.        Left eye: No  discharge.     Conjunctiva/sclera: Conjunctivae normal.  Neck:     Thyroid: No thyromegaly.     Vascular: No carotid bruit.  Cardiovascular:     Rate and Rhythm: Normal rate and regular rhythm.     Pulses: Normal pulses.     Heart sounds: Normal heart sounds.  Pulmonary:     Effort: Pulmonary effort is normal. No respiratory distress.     Breath sounds: No wheezing.  Chest:  Breasts:    Right: No mass, nipple discharge, skin change or tenderness.     Left: No mass, nipple discharge, skin change or tenderness.  Abdominal:     General: Bowel sounds are normal.     Palpations: Abdomen is soft.     Tenderness: There is no abdominal tenderness.  Musculoskeletal:     Cervical back: Normal range of motion. No erythema.     Right lower leg: No edema.     Left lower leg: No edema.  Lymphadenopathy:     Cervical: No cervical adenopathy.  Skin:    General: Skin is warm and dry.     Findings: No rash.  Neurological:     Mental Status: She is alert and oriented to person, place, and time.     Cranial Nerves: No cranial nerve deficit.     Sensory: No sensory deficit.     Deep Tendon Reflexes: Reflexes are normal and symmetric.  Psychiatric:        Attention and Perception: Attention normal.        Mood and Affect: Mood normal.      Wt Readings from Last 3 Encounters:  08/06/22 151 lb (68.5 kg)  04/03/22 148 lb (67.1 kg)  01/29/22 149 lb (67.6 kg)    BP 120/78 (BP Location: Right Arm, Cuff Size: Normal)   Pulse 89   Ht _0  (1.575 m)   Wt 151 lb (68.5 kg)   SpO2 98%   BMI 27.62 kg/m   Assessment and Plan: 1. Annual physical exam Normal exam Some stress at home with her husband/daughter - consider counseling online; follow up if needed - CBC with Differential/Platelet - Comprehensive metabolic panel - Hemoglobin A1c - Lipid panel - TSH  2. Encounter for screening mammogram for breast cancer - MM 3D SCREEN BREAST BILATERAL  3. Essential hypertension Clinically stable exam with well controlled BP. Tolerating medications without side effects at this time. Pt to continue current regimen and low sodium diet; benefits of regular exercise as able discussed. - CBC with Differential/Platelet - TSH  4. Type II diabetes mellitus with complication (HCC) Clinically stable by exam and report without s/s of hypoglycemia. DM complicated by hypertension and dyslipidemia. Tolerating medications well without side effects or other concerns. Eye exam suggested borderline glaucoma and she is concerned whether or not she should see an Eye MD.  Will request exam notes to review. - Comprehensive metabolic panel - Hemoglobin A1c - Microalbumin / creatinine urine ratio  5. Hyperlipidemia associated with type 2 diabetes mellitus (HCC) - Lipid panel  6. Gastroesophageal reflux disease without esophagitis - CBC with Differential/Platelet  7. Colon cancer screening - Fecal occult blood, imunochemical  She will call to schedule her pelvic US that was cancelled due to staffing.  Partially dictated using Editor, commissioning. Any errors are unintentional.  Halina Maidens, MD [facility]  08/06/2022

## 2022-08-07 LAB — CBC WITH DIFFERENTIAL/PLATELET
Basophils Absolute: 0 10*3/uL (ref 0.0–0.2)
Basos: 0 %
EOS (ABSOLUTE): 0.1 10*3/uL (ref 0.0–0.4)
Eos: 2 %
Hematocrit: 37.9 % (ref 34.0–46.6)
Hemoglobin: 13.3 g/dL (ref 11.1–15.9)
Immature Grans (Abs): 0 10*3/uL (ref 0.0–0.1)
Immature Granulocytes: 0 %
Lymphocytes Absolute: 2 10*3/uL (ref 0.7–3.1)
Lymphs: 29 %
MCH: 31.5 pg (ref 26.6–33.0)
MCHC: 35.1 g/dL (ref 31.5–35.7)
MCV: 90 fL (ref 79–97)
Monocytes Absolute: 0.6 10*3/uL (ref 0.1–0.9)
Monocytes: 8 %
Neutrophils Absolute: 4.2 10*3/uL (ref 1.4–7.0)
Neutrophils: 61 %
Platelets: 307 10*3/uL (ref 150–450)
RBC: 4.22 x10E6/uL (ref 3.77–5.28)
RDW: 11.9 % (ref 11.7–15.4)
WBC: 6.9 10*3/uL (ref 3.4–10.8)

## 2022-08-07 LAB — COMPREHENSIVE METABOLIC PANEL
ALT: 14 IU/L (ref 0–32)
AST: 14 IU/L (ref 0–40)
Albumin/Globulin Ratio: 1.6 (ref 1.2–2.2)
Albumin: 4.2 g/dL (ref 3.8–4.9)
Alkaline Phosphatase: 115 IU/L (ref 44–121)
BUN/Creatinine Ratio: 14 (ref 9–23)
BUN: 11 mg/dL (ref 6–24)
Bilirubin Total: 0.3 mg/dL (ref 0.0–1.2)
CO2: 23 mmol/L (ref 20–29)
Calcium: 9.3 mg/dL (ref 8.7–10.2)
Chloride: 98 mmol/L (ref 96–106)
Creatinine, Ser: 0.78 mg/dL (ref 0.57–1.00)
Globulin, Total: 2.6 g/dL (ref 1.5–4.5)
Glucose: 161 mg/dL — ABNORMAL HIGH (ref 70–99)
Potassium: 4.9 mmol/L (ref 3.5–5.2)
Sodium: 136 mmol/L (ref 134–144)
Total Protein: 6.8 g/dL (ref 6.0–8.5)
eGFR: 90 mL/min/{1.73_m2} (ref >59)

## 2022-08-07 LAB — LIPID PANEL
Chol/HDL Ratio: 4.6 ratio — ABNORMAL HIGH (ref 0.0–4.4)
Cholesterol, Total: 196 mg/dL (ref 100–199)
HDL: 43 mg/dL (ref >39)
LDL Chol Calc (NIH): 139 mg/dL — ABNORMAL HIGH (ref 0–99)
Triglycerides: 77 mg/dL (ref 0–149)
VLDL Cholesterol Cal: 14 mg/dL (ref 5–40)

## 2022-08-07 LAB — MICROALBUMIN / CREATININE URINE RATIO
Creatinine, Urine: 53 mg/dL
Microalb/Creat Ratio: <6 mg/g creat (ref 0–29)
Microalbumin, Urine: <3 ug/mL

## 2022-08-07 LAB — HEMOGLOBIN A1C
Est. average glucose Bld gHb Est-mCnc: 166 mg/dL
Hgb A1c MFr Bld: 7.4 % — ABNORMAL HIGH (ref 4.8–5.6)

## 2022-08-07 LAB — TSH: TSH: 1.95 u[IU]/mL (ref 0.450–4.500)

## 2022-08-08 ENCOUNTER — Other Ambulatory Visit: Payer: Self-pay | Admitting: Internal Medicine

## 2022-08-08 DIAGNOSIS — E1169 Type 2 diabetes mellitus with other specified complication: Secondary | ICD-10-CM

## 2022-08-08 MED ORDER — ATORVASTATIN CALCIUM 20 MG PO TABS: 20.0000 mg | ORAL_TABLET | Freq: Every day | ORAL | 1 refills | Status: DC

## 2022-08-28 ENCOUNTER — Telehealth: Payer: Self-pay

## 2022-08-28 NOTE — Telephone Encounter (Signed)
Called and spoke with patient on the phone. Reminded her of scheduling her mammogram, and completing her fecal occult test. She verbalized understanding.  - Meagan Gordon

## 2022-09-22 DIAGNOSIS — Z1211 Encounter for screening for malignant neoplasm of colon: Secondary | ICD-10-CM | POA: Diagnosis not present

## 2022-09-24 ENCOUNTER — Other Ambulatory Visit: Payer: Self-pay | Admitting: Internal Medicine

## 2022-09-24 DIAGNOSIS — M5441 Lumbago with sciatica, right side: Secondary | ICD-10-CM

## 2022-09-24 LAB — FECAL OCCULT BLOOD, IMMUNOCHEMICAL: Fecal Occult Bld: NEGATIVE

## 2022-09-24 NOTE — Telephone Encounter (Signed)
Requested Prescriptions  Pending Prescriptions Disp Refills   meloxicam (MOBIC) 15 MG tablet [Pharmacy Med Name: MELOXICAM 15 MG TABLET] 90 tablet 1    Sig: TAKE 1 TABLET (15 MG TOTAL) BY MOUTH DAILY.     Analgesics:  COX2 Inhibitors Failed - 09/24/2022  1:57 AM      Failed - Manual Review: Labs are only required if the patient has taken medication for more than 8 weeks.      Passed - HGB in normal range and within 360 days    Hemoglobin  Date Value Ref Range Status  08/06/2022 13.3 11.1 - 15.9 g/dL Final         Passed - Cr in normal range and within 360 days    Creatinine, Ser  Date Value Ref Range Status  08/06/2022 0.78 0.57 - 1.00 mg/dL Final         Passed - HCT in normal range and within 360 days    Hematocrit  Date Value Ref Range Status  08/06/2022 37.9 34.0 - 46.6 % Final         Passed - AST in normal range and within 360 days    AST  Date Value Ref Range Status  08/06/2022 14 0 - 40 IU/L Final         Passed - ALT in normal range and within 360 days    ALT  Date Value Ref Range Status  08/06/2022 14 0 - 32 IU/L Final         Passed - eGFR is 30 or above and within 360 days    GFR calc Af Amer  Date Value Ref Range Status  07/31/2020 >60 >60 mL/min Final   GFR calc non Af Amer  Date Value Ref Range Status  07/31/2020 >60 >60 mL/min Final   eGFR  Date Value Ref Range Status  08/06/2022 90 >59 mL/min/1.73 Final         Passed - Patient is not pregnant      Passed - Valid encounter within last 12 months    Recent Outpatient Visits           1 month ago Annual physical exam   [place] [facility] at [facility], Jesse Sans, MD   5 months ago Type II diabetes mellitus with complication [facility])   [place] [facility] at [facility], Jesse Sans, MD   9 months ago Essential hypertension   [place] [facility] at [facility], Jesse Sans, MD   1 year  ago Annual physical exam   [facility] at [facility], Jesse Sans, MD   1 year ago Acute bacterial rhinosinusitis    Primary Care and Sports Medicine at [facility], Earley Abide, MD       Future Appointments             In 2 months Army Melia, Jesse Sans, MD [facility] Primary Care and [facility] at [facility], [facility]   In 10 months Army Melia, Jesse Sans, MD [place] Primary Care and Sports Medicine at [facility], [facility]

## 2022-10-08 ENCOUNTER — Other Ambulatory Visit: Payer: Self-pay | Admitting: Internal Medicine

## 2022-10-08 DIAGNOSIS — E118 Type 2 diabetes mellitus with unspecified complications: Secondary | ICD-10-CM

## 2022-10-08 DIAGNOSIS — K219 Gastro-esophageal reflux disease without esophagitis: Secondary | ICD-10-CM

## 2022-10-08 NOTE — Telephone Encounter (Signed)
Requested Prescriptions  Pending Prescriptions Disp Refills   metFORMIN (GLUCOPHAGE-XR) 500 MG 24 hr tablet [Pharmacy Med Name: METFORMIN HCL ER 500 MG TABLET] 90 tablet 0    Sig: TAKE 1 TABLET BY MOUTH EVERY DAY WITH BREAKFAST     Endocrinology:  Diabetes - Biguanides Failed - 10/08/2022  2:41 AM      Failed - B12 Level in normal range and within 720 days    No results found for: "VITAMINB12"       Passed - Cr in normal range and within 360 days    Creatinine, Ser  Date Value Ref Range Status  08/06/2022 0.78 0.57 - 1.00 mg/dL Final         Passed - HBA1C is between 0 and 7.9 and within 180 days    Hgb A1c MFr Bld  Date Value Ref Range Status  08/06/2022 7.4 (H) 4.8 - 5.6 % Final    Comment:             Prediabetes: 5.7 - 6.4          Diabetes: >6.4          Glycemic control for adults with diabetes: <7.0          Passed - eGFR in normal range and within 360 days    GFR calc Af Amer  Date Value Ref Range Status  07/31/2020 >60 >60 mL/min Final   GFR calc non Af Amer  Date Value Ref Range Status  07/31/2020 >60 >60 mL/min Final   eGFR  Date Value Ref Range Status  08/06/2022 90 >59 mL/min/1.73 Final         Passed - Valid encounter within last 6 months    Recent Outpatient Visits           2 months ago Annual physical exam   [place] [facility] Medicine at [facility], Jesse Sans, MD   6 months ago Type II diabetes mellitus with complication [facility])   [place] [facility] Medicine at [facility], Jesse Sans, MD   9 months ago Essential hypertension   [place] [facility] Care and [facility] Medicine at [facility], Jesse Sans, MD   1 year ago Annual physical exam   [place] [facility] and [facility] Medicine at [facility], Jesse Sans, MD   1 year ago Acute bacterial rhinosinusitis   [place] Primary Care and Sports Medicine at [place], Earley Abide, MD       Future  Appointments             In 2 months Army Melia Jesse Sans, MD [facility] Primary Care and [facility] Medicine at [facility], [facility]   In 10 months Glean Hess, MD [facility] Primary Care and Sports Medicine at [facility], [place] within normal limits and completed in the last 12 months    WBC  Date Value Ref Range Status  08/06/2022 6.9 3.4 - 10.8 x10E3/uL Final  07/31/2020 7.1 4.0 - 10.5 K/uL Final   RBC  Date Value Ref Range Status  08/06/2022 4.22 3.77 - 5.28 x10E6/uL Final  07/31/2020 4.41 3.87 - 5.11 MIL/uL Final   Hemoglobin  Date Value Ref Range Status  08/06/2022 13.3 11.1 - 15.9 g/dL Final   Hematocrit  Date Value Ref Range Status  08/06/2022 37.9 34.0 - 46.6 % Final   MCHC  Date Value Ref Range Status  08/06/2022 35.1 31.5 - 35.7 g/dL Final  07/31/2020 34.4 30.0 - 36.0 g/dL Final   [facility]  Date Value Ref Range Status  08/06/2022 31.5 26.6 - 33.0 pg Final  07/31/2020 31.1 26.0 - 34.0 pg Final   MCV  Date Value Ref Range Status  08/06/2022 90 79 - 97 fL Final   No results found for: "PLTCOUNTKUC", "LABPLAT", "POCPLA" RDW  Date Value Ref Range Status  08/06/2022 11.9 11.7 - 15.4 % Final          omeprazole (PRILOSEC) 20 MG capsule [Pharmacy Med Name: OMEPRAZOLE DR 20 MG CAPSULE] 180 capsule 1    Sig: TAKE 1 CAPSULE (20 MG TOTAL) BY MOUTH 2 (TWO) TIMES DAILY BEFORE A MEAL.     Gastroenterology: Proton Pump Inhibitors Passed - 10/08/2022  2:41 AM      Passed - Valid encounter within last 12 months    Recent Outpatient Visits           2 months ago Annual physical exam   [place] [facility] at [facility], Jesse Sans, MD   6 months ago Type II diabetes mellitus with complication [facility])   [place] [facility] Medicine at [facility], Jesse Sans, MD   9 months ago Essential hypertension   [place] [facility] and Sports Medicine at [facility],  Jesse Sans, MD   1 year ago Annual physical exam   [facility] Primary Care and [facility] Medicine at [facility], Jesse Sans, MD   1 year ago Acute bacterial rhinosinusitis   [place] Primary Care and Sports Medicine at [facility], Earley Abide, MD       Future Appointments             In 2 months Army Melia Jesse Sans, MD [facility] Primary Care and Sports Medicine at [facility], [facility]   In 10 months Army Melia, Jesse Sans, MD [place] Primary Care and Sports Medicine at [facility], [facility]

## 2022-10-09 ENCOUNTER — Other Ambulatory Visit: Payer: Self-pay | Admitting: Internal Medicine

## 2022-10-09 DIAGNOSIS — E1169 Type 2 diabetes mellitus with other specified complication: Secondary | ICD-10-CM

## 2022-10-09 NOTE — Telephone Encounter (Signed)
Dosage cahnged to 20mg . Requested Prescriptions  Pending Prescriptions Disp Refills   atorvastatin (LIPITOR) 10 MG tablet [Pharmacy Med Name: ATORVASTATIN 10 MG TABLET] 90 tablet 0    Sig: TAKE 1 TABLET BY MOUTH EVERY DAY     Cardiovascular:  Antilipid - Statins Failed - 10/09/2022  2:39 AM      Failed - Lipid Panel in normal range within the last 12 months    Cholesterol, Total  Date Value Ref Range Status  08/06/2022 196 100 - 199 mg/dL Final   LDL Chol Calc (NIH)  Date Value Ref Range Status  08/06/2022 139 (H) 0 - 99 mg/dL Final   HDL  Date Value Ref Range Status  08/06/2022 43 >39 mg/dL Final   Triglycerides  Date Value Ref Range Status  08/06/2022 77 0 - 149 mg/dL Final         Passed - Patient is not pregnant      Passed - Valid encounter within last 12 months    Recent Outpatient Visits           2 months ago Annual physical exam   [place] [facility] at [facility], [facility], MD   6 months ago Type II diabetes mellitus with complication [facility])   [place] [facility] at [facility], [facility], MD   9 months ago Essential hypertension   [place] [facility] at [facility], [facility], MD   1 year ago Annual physical exam   [facility] at [facility], [facility], MD   1 year ago Acute bacterial rhinosinusitis   [place] [facility] at [facility], [facility], MD       Future Appointments             In 2 months Ocie Bob, Judithann Graves, MD [facility] at [facility], [facility]   In 10 months [facility], Judithann Graves, MD [facility] Medicine at [facility], [facility]

## 2022-10-10 ENCOUNTER — Ambulatory Visit
Admission: RE | Admit: 2022-10-10 | Discharge: 2022-10-10 | Disposition: A | Discharge status: Home / Self Care | Payer: BC Managed Care – PPO | Source: Ambulatory Visit | Attending: Internal Medicine | Admitting: Internal Medicine

## 2022-10-10 DIAGNOSIS — Z1231 Encounter for screening mammogram for malignant neoplasm of breast: Secondary | ICD-10-CM | POA: Diagnosis not present

## 2022-10-14 ENCOUNTER — Other Ambulatory Visit: Payer: Self-pay | Admitting: Internal Medicine

## 2022-10-14 DIAGNOSIS — R928 Other abnormal and inconclusive findings on diagnostic imaging of breast: Secondary | ICD-10-CM

## 2022-10-14 DIAGNOSIS — N63 Unspecified lump in unspecified breast: Secondary | ICD-10-CM

## 2022-10-15 ENCOUNTER — Encounter: Payer: Self-pay | Admitting: Internal Medicine

## 2022-10-15 ENCOUNTER — Other Ambulatory Visit: Payer: Self-pay

## 2022-10-15 DIAGNOSIS — I10 Essential (primary) hypertension: Secondary | ICD-10-CM

## 2022-10-15 MED ORDER — LISINOPRIL 10 MG PO TABS: 10.0000 mg | ORAL_TABLET | Freq: Every day | ORAL | 1 refills | Status: DC

## 2022-10-16 ENCOUNTER — Ambulatory Visit
Admission: RE | Admit: 2022-10-16 | Discharge: 2022-10-16 | Disposition: A | Discharge status: Home / Self Care | Payer: BC Managed Care – PPO | Source: Ambulatory Visit | Attending: Internal Medicine | Admitting: Internal Medicine

## 2022-10-16 DIAGNOSIS — R928 Other abnormal and inconclusive findings on diagnostic imaging of breast: Secondary | ICD-10-CM

## 2022-10-16 DIAGNOSIS — R92322 Mammographic fibroglandular density, left breast: Secondary | ICD-10-CM | POA: Diagnosis not present

## 2022-10-16 DIAGNOSIS — N63 Unspecified lump in unspecified breast: Secondary | ICD-10-CM | POA: Diagnosis not present

## 2022-10-16 DIAGNOSIS — N6002 Solitary cyst of left breast: Secondary | ICD-10-CM | POA: Diagnosis not present

## 2022-12-08 ENCOUNTER — Encounter: Payer: Self-pay | Admitting: Internal Medicine

## 2022-12-08 ENCOUNTER — Ambulatory Visit: Payer: BC Managed Care – PPO | Admitting: Internal Medicine

## 2022-12-08 VITALS — BP 124/70 | HR 69 | Ht 62.0 in | Wt 153.0 lb

## 2022-12-08 DIAGNOSIS — F411 Generalized anxiety disorder: Secondary | ICD-10-CM | POA: Insufficient documentation

## 2022-12-08 DIAGNOSIS — J3089 Other allergic rhinitis: Secondary | ICD-10-CM | POA: Diagnosis not present

## 2022-12-08 DIAGNOSIS — E785 Hyperlipidemia, unspecified: Secondary | ICD-10-CM

## 2022-12-08 DIAGNOSIS — E1169 Type 2 diabetes mellitus with other specified complication: Secondary | ICD-10-CM

## 2022-12-08 DIAGNOSIS — M7662 Achilles tendinitis, left leg: Secondary | ICD-10-CM | POA: Diagnosis not present

## 2022-12-08 DIAGNOSIS — E118 Type 2 diabetes mellitus with unspecified complications: Secondary | ICD-10-CM | POA: Diagnosis not present

## 2022-12-08 MED ORDER — METFORMIN HCL ER 500 MG PO TB24: ORAL_TABLET | ORAL | 1 refills | Status: DC

## 2022-12-08 MED ORDER — ESCITALOPRAM OXALATE 10 MG PO TABS: 10.0000 mg | ORAL_TABLET | Freq: Every day | ORAL | 1 refills | Status: DC

## 2022-12-08 NOTE — Assessment & Plan Note (Addendum)
Clinically stable without s/s of hypoglycemia. Stopped metformin a month or more ago for unclear reasons Recommend resuming Will check labs

## 2022-12-08 NOTE — Progress Notes (Signed)
Date:  12/08/2022   Name:  Meagan Gordon   DOB:  06/22/1966   MRN:  [id number]   Chief Complaint: Hypertension, Diabetes, and Foot Pain (Left Foot. No injury. Patient having difficulty walking on foot. Painful.)  Diabetes She presents for her follow-up diabetic visit. She has type 2 diabetes mellitus. Her disease course has been stable. Hypoglycemia symptoms include nervousness/anxiousness. Pertinent negatives for hypoglycemia include no headaches or tremors. Pertinent negatives for diabetes include no chest pain, no fatigue, no polydipsia and no polyuria. Current diabetic treatment includes oral agent (monotherapy) (stopped metformin a month ago due to forgetting).  Hyperlipidemia This is a recurrent problem. The problem is uncontrolled. Recent lipid tests were reviewed and are high. Pertinent negatives include no chest pain or shortness of breath. Current antihyperlipidemic treatment includes statins (dose increased last visit but pt not taking meds).  Foot Injury  There was no injury mechanism. Pertinent negatives include no numbness.  Sinus Problem This is a recurrent problem. The problem has been waxing and waning since onset. Associated symptoms include congestion, sinus pressure and a sore throat. Pertinent negatives include no coughing, headaches or shortness of breath. Past treatments include nothing.     Lab Results  Component Value Date   NA 136 08/06/2022   K 4.9 08/06/2022   CO2 23 08/06/2022   GLUCOSE 161 (H) 08/06/2022   BUN 11 08/06/2022   CREATININE 0.78 08/06/2022   CALCIUM 9.3 08/06/2022   EGFR 90 08/06/2022   GFRNONAA >60 07/31/2020   Lab Results  Component Value Date   CHOL 196 08/06/2022   HDL 43 08/06/2022   LDLCALC 139 (H) 08/06/2022   TRIG 77 08/06/2022   CHOLHDL 4.6 (H) 08/06/2022   Lab Results  Component Value Date   TSH 1.950 08/06/2022   Lab Results  Component Value Date   HGBA1C 7.4 (H) 08/06/2022   Lab Results  Component Value Date   WBC  6.9 08/06/2022   HGB 13.3 08/06/2022   HCT 37.9 08/06/2022   MCV 90 08/06/2022   PLT 307 08/06/2022   Lab Results  Component Value Date   ALT 14 08/06/2022   AST 14 08/06/2022   ALKPHOS 115 08/06/2022   BILITOT 0.3 08/06/2022   No results found for: "25OHVITD2", "25OHVITD3", "VD25OH"   Review of Systems  Constitutional:  Negative for appetite change, fatigue, fever and unexpected weight change.  HENT:  Positive for congestion, sinus pressure and sore throat. Negative for tinnitus and trouble swallowing.   Eyes:  Negative for visual disturbance.  Respiratory:  Negative for cough, chest tightness and shortness of breath.   Cardiovascular:  Negative for chest pain, palpitations and leg swelling.  Gastrointestinal:  Negative for abdominal pain.  Endocrine: Negative for polydipsia and polyuria.  Genitourinary:  Negative for dysuria and hematuria.  Musculoskeletal:  Positive for arthralgias and gait problem.  Neurological:  Negative for tremors, numbness and headaches.  Psychiatric/Behavioral:  Negative for dysphoric mood and sleep disturbance. The patient is nervous/anxious.     Patient Active Problem List   Diagnosis Date Noted   Generalized anxiety disorder 12/08/2022   Environmental and seasonal allergies 12/08/2022   Essential hypertension 12/24/2021   Hyperlipidemia associated with type 2 diabetes mellitus ([place]) 10/01/2021   Neck muscle spasm 11/08/2018   PCOS (polycystic ovarian syndrome) 05/28/2018   Gastroesophageal reflux disease without esophagitis 05/28/2018   Breast mass, right 07/19/2016   Type II diabetes mellitus with complication ([place]) 24/40/1027    No Known Allergies  Past  Surgical History:  Procedure Laterality Date   BREAST BIOPSY Left 09/11/2021   BENIGN MAMMARY PARENCHYMA WITH FIBROCYSTIC AND COLUMNAR CELL CHANGES   CESAREAN SECTION  2000   LEEP  1996    Social History   Tobacco Use   Smoking status: Never   Smokeless tobacco: Never  Vaping Use    Vaping Use: Never used  Substance Use Topics   Alcohol use: Yes   Drug use: No     Medication list has been reviewed and updated.  Current Meds  Medication Sig   escitalopram (LEXAPRO) 10 MG tablet Take 1 tablet (10 mg total) by mouth daily.   lisinopril (ZESTRIL) 10 MG tablet Take 1 tablet (10 mg total) by mouth daily.   meloxicam (MOBIC) 15 MG tablet TAKE 1 TABLET (15 MG TOTAL) BY MOUTH DAILY.   omeprazole (PRILOSEC) 20 MG capsule TAKE 1 CAPSULE (20 MG TOTAL) BY MOUTH 2 (TWO) TIMES DAILY BEFORE A MEAL.   spironolactone (ALDACTONE) 50 MG tablet TAKE 1 TABLET BY MOUTH TWICE A DAY   [DISCONTINUED] dicyclomine (BENTYL) 10 MG capsule TAKE 1 CAPSULE (10 MG TOTAL) BY MOUTH 3 (THREE) TIMES DAILY BEFORE MEALS.       12/08/2022   10:47 AM 08/06/2022    8:34 AM 04/03/2022   10:08 AM 12/24/2021    3:38 PM  GAD 7 : Generalized Anxiety Score  Nervous, Anxious, on Edge 2 2 2 1   Control/stop worrying 3 1 1 1   Worry too much - different things 2 2 2 1   Trouble relaxing 3 2 2 1   Restless 2 2 2 1   Easily annoyed or irritable 2 1 2 1   Afraid - awful might happen 1 2 1 2   Total GAD 7 Score 15 12 12 8   Anxiety Difficulty Very difficult Somewhat difficult Somewhat difficult        12/08/2022   10:47 AM 08/06/2022    8:34 AM 04/03/2022   10:07 AM  Depression screen PHQ 2/9  Decreased Interest 2 2 1   Down, Depressed, Hopeless 1 1 1   PHQ - 2 Score 3 3 2   Altered sleeping 2 2 1   Tired, decreased energy 2 2 2   Change in appetite 3 2 2   Feeling bad or failure about yourself  2 1 0  Trouble concentrating 1 2 2   Moving slowly or fidgety/restless 2 2 2   Suicidal thoughts 0 0 0  PHQ-9 Score 15 14 11   Difficult doing work/chores Very difficult Somewhat difficult Somewhat difficult    BP Readings from Last 3 Encounters:  12/08/22 124/70  08/06/22 120/78  04/03/22 126/78    Physical Exam Vitals and nursing note reviewed.  Constitutional:      General: She is not in acute distress.     Appearance: She is well-developed.  HENT:     Head: Normocephalic and atraumatic.  Cardiovascular:     Rate and Rhythm: Normal rate and regular rhythm.  Pulmonary:     Effort: Pulmonary effort is normal. No respiratory distress.     Breath sounds: No wheezing or rhonchi.  Musculoskeletal:        General: Swelling and tenderness present.     Left foot: Tenderness (over achilles tendon insertion) present.  Skin:    General: Skin is warm and dry.     Findings: No rash.  Neurological:     Mental Status: She is alert and oriented to person, place, and time.  Psychiatric:        Attention and  Perception: Attention normal.        Mood and Affect: Mood is anxious.        Behavior: Behavior normal.     Wt Readings from Last 3 Encounters:  12/08/22 153 lb (69.4 kg)  08/06/22 151 lb (68.5 kg)  04/03/22 148 lb (67.1 kg)    BP 124/70   Pulse 69   Ht 5\' 2"  (1.575 m)   Wt 153 lb (69.4 kg)   SpO2 97%   BMI 27.98 kg/m   Assessment and Plan: Problem List Items Addressed This Visit       Endocrine   Hyperlipidemia associated with type 2 diabetes mellitus ([place]) (Chronic)    Tolerating statin medication without side effects at this time LDL is  Lab Results  Component Value Date   LDLCALC 139 (H) 08/06/2022  with a goal of < 70. Lipitor increased to 20 mg last visit but patient is not taking. Resume medications and will check lipids next visit       Relevant Medications   metFORMIN (GLUCOPHAGE-XR) 500 MG 24 hr tablet   Other Relevant Orders   Comprehensive metabolic panel   Type II diabetes mellitus with complication (HCC) - Primary (Chronic)    Clinically stable without s/s of hypoglycemia. Stopped metformin a month or more ago for unclear reasons Recommend resuming Will check labs       Relevant Medications   metFORMIN (GLUCOPHAGE-XR) 500 MG 24 hr tablet   Other Relevant Orders   Comprehensive metabolic panel   Hemoglobin A1c     Other   Environmental and seasonal  allergies (Chronic)    Symptoms of recurrent congestion, PND and sore throat Recommend Claritin 10 mg daily      Generalized anxiety disorder (Chronic)   Relevant Medications   escitalopram (LEXAPRO) 10 MG tablet   Other Visit Diagnoses     Achilles tendinitis of left lower extremity       take Mobic daily ice at the end of the day        Partially dictated using [employer], [employer]. Any errors are unintentional.  Halina Maidens, MD [facility]  12/08/2022

## 2022-12-08 NOTE — Patient Instructions (Addendum)
For heel pain - take Mobic daily and ice the heel for 10 minutes at the end of the day.  Start Lexapro 10 mg - one half daily for 3 days then 1 pill per day.  Start Claritin 10 mg daily.

## 2022-12-08 NOTE — Assessment & Plan Note (Signed)
Symptoms of recurrent congestion, PND and sore throat Recommend Claritin 10 mg daily

## 2022-12-08 NOTE — Assessment & Plan Note (Addendum)
Tolerating statin medication without side effects at this time LDL is  Lab Results  Component Value Date   LDLCALC 139 (H) 08/06/2022   with a goal of < 70. Lipitor increased to 20 mg last visit but patient is not taking. Resume medications and will check lipids next visit

## 2022-12-09 LAB — COMPREHENSIVE METABOLIC PANEL
ALT: 23 IU/L (ref 0–32)
AST: 18 IU/L (ref 0–40)
Albumin/Globulin Ratio: 1.6 (ref 1.2–2.2)
Albumin: 4.6 g/dL (ref 3.8–4.9)
Alkaline Phosphatase: 118 IU/L (ref 44–121)
BUN/Creatinine Ratio: 13 (ref 9–23)
BUN: 9 mg/dL (ref 6–24)
Bilirubin Total: 0.3 mg/dL (ref 0.0–1.2)
CO2: 22 mmol/L (ref 20–29)
Calcium: 9.8 mg/dL (ref 8.7–10.2)
Chloride: 99 mmol/L (ref 96–106)
Creatinine, Ser: 0.71 mg/dL (ref 0.57–1.00)
Globulin, Total: 2.8 g/dL (ref 1.5–4.5)
Glucose: 176 mg/dL — ABNORMAL HIGH (ref 70–99)
Potassium: 4.5 mmol/L (ref 3.5–5.2)
Sodium: 137 mmol/L (ref 134–144)
Total Protein: 7.4 g/dL (ref 6.0–8.5)
eGFR: 101 mL/min/{1.73_m2} (ref >59)

## 2022-12-09 LAB — HEMOGLOBIN A1C
Est. average glucose Bld gHb Est-mCnc: 189 mg/dL
Hgb A1c MFr Bld: 8.2 % — ABNORMAL HIGH (ref 4.8–5.6)

## 2023-01-10 ENCOUNTER — Other Ambulatory Visit: Payer: Self-pay | Admitting: Internal Medicine

## 2023-01-10 DIAGNOSIS — F411 Generalized anxiety disorder: Secondary | ICD-10-CM

## 2023-01-12 NOTE — Telephone Encounter (Signed)
Requested medication (s) are due for refill today: yes  Requested medication (s) are on the active medication list: yes  Last refill:  12/08/22  Future visit scheduled: yes  Notes to clinic:   [place]. DX Code Needed.      Requested Prescriptions  Pending Prescriptions Disp Refills   escitalopram (LEXAPRO) 10 MG tablet [Pharmacy Med Name: ESCITALOPRAM 10 MG TABLET] 90 tablet 1    Sig: TAKE 1 TABLET BY MOUTH EVERY DAY     Psychiatry:  Antidepressants - SSRI Passed - 01/10/2023  1:33 PM      Passed - Valid encounter within last 6 months    Recent Outpatient Visits           1 month ago Type II diabetes mellitus with complication [facility])   [place] [facility] at [place], Jesse Sans, MD   5 months ago Annual physical exam   [place] at [facility], Jesse Sans, MD   9 months ago Type II diabetes mellitus with complication [facility]   [place] [facility] at [facility], Jesse Sans, MD   1 year ago Essential hypertension   [place] at [facility], Jesse Sans, MD   1 year ago Annual physical exam   [place] at [facility], Jesse Sans, MD       Future Appointments             In 2 weeks Army Melia, Jesse Sans, MD [place] at [facility], [facility]   In 7 months Army Melia, Jesse Sans, MD [place] at [facility], [facility]

## 2023-01-28 ENCOUNTER — Encounter: Payer: Self-pay | Admitting: Internal Medicine

## 2023-01-28 ENCOUNTER — Ambulatory Visit: Payer: BC Managed Care – PPO | Admitting: Internal Medicine

## 2023-01-28 VITALS — BP 130/68 | HR 88 | Ht 62.0 in | Wt 150.0 lb

## 2023-01-28 DIAGNOSIS — F411 Generalized anxiety disorder: Secondary | ICD-10-CM

## 2023-01-28 DIAGNOSIS — R42 Dizziness and giddiness: Secondary | ICD-10-CM

## 2023-01-28 MED ORDER — BUSPIRONE HCL 10 MG PO TABS: 10.0000 mg | ORAL_TABLET | Freq: Two times a day (BID) | ORAL | 2 refills | Status: DC

## 2023-01-28 NOTE — Progress Notes (Signed)
Date:  01/28/2023   Name:  Meagan Gordon   DOB:  23-Dec-1965   MRN:  [id number]   Chief Complaint: Depression  Depression        This is a chronic problem.  The onset quality is undetermined. The problem is unchanged.  Associated symptoms include decreased concentration, fatigue, hopelessness, restlessness, decreased interest and sad.  Associated symptoms include no headaches and no suicidal ideas.  Past treatments include SSRIs - Selective serotonin reuptake inhibitors.  Compliance with treatment is good.  Previous treatment provided mild relief. Dizziness This is a new problem. The current episode started more than 1 month ago. The problem occurs intermittently. The problem has been unchanged (sensation of being off balance). Associated symptoms include fatigue. Pertinent negatives include no chills, coughing, fever, headaches, visual change or vomiting. The symptoms are aggravated by bending and walking. She has tried nothing for the symptoms.    Lab Results  Component Value Date   NA 137 12/08/2022   K 4.5 12/08/2022   CO2 22 12/08/2022   GLUCOSE 176 (H) 12/08/2022   BUN 9 12/08/2022   CREATININE 0.71 12/08/2022   CALCIUM 9.8 12/08/2022   EGFR 101 12/08/2022   GFRNONAA >60 07/31/2020   Lab Results  Component Value Date   CHOL 196 08/06/2022   HDL 43 08/06/2022   LDLCALC 139 (H) 08/06/2022   TRIG 77 08/06/2022   CHOLHDL 4.6 (H) 08/06/2022   Lab Results  Component Value Date   TSH 1.950 08/06/2022   Lab Results  Component Value Date   HGBA1C 8.2 (H) 12/08/2022   Lab Results  Component Value Date   WBC 6.9 08/06/2022   HGB 13.3 08/06/2022   HCT 37.9 08/06/2022   MCV 90 08/06/2022   PLT 307 08/06/2022   Lab Results  Component Value Date   ALT 23 12/08/2022   AST 18 12/08/2022   ALKPHOS 118 12/08/2022   BILITOT 0.3 12/08/2022   No results found for: "25OHVITD2", "25OHVITD3", "VD25OH"   Review of Systems  Constitutional:  Positive for fatigue. Negative for  chills and fever.  HENT:  Negative for ear pain, postnasal drip, sinus pressure and trouble swallowing.   Respiratory:  Negative for cough and chest tightness.   Gastrointestinal:  Negative for vomiting.  Neurological:  Positive for dizziness. Negative for headaches.  Psychiatric/Behavioral:  Positive for decreased concentration and depression. Negative for suicidal ideas. The patient is nervous/anxious.     Patient Active Problem List   Diagnosis Date Noted   Generalized anxiety disorder 12/08/2022   Environmental and seasonal allergies 12/08/2022   Essential hypertension 12/24/2021   Hyperlipidemia associated with type 2 diabetes mellitus ([place]) 10/01/2021   Neck muscle spasm 11/08/2018   PCOS (polycystic ovarian syndrome) 05/28/2018   Gastroesophageal reflux disease without esophagitis 05/28/2018   Breast mass, right 07/19/2016   Type II diabetes mellitus with complication ([place]) [id number]    No Known Allergies  Past Surgical History:  Procedure Laterality Date   BREAST BIOPSY Left 09/11/2021   BENIGN MAMMARY PARENCHYMA WITH FIBROCYSTIC AND COLUMNAR CELL CHANGES   CESAREAN SECTION  2000   LEEP  1996    Social History   Tobacco Use   Smoking status: Never   Smokeless tobacco: Never  Vaping Use   Vaping Use: Never used  Substance Use Topics   Alcohol use: Yes   Drug use: No     Medication list has been reviewed and updated.  Current Meds  Medication Sig   busPIRone (BUSPAR)  10 MG tablet Take 1 tablet (10 mg total) by mouth 2 (two) times daily.   escitalopram (LEXAPRO) 10 MG tablet Take 1 tablet (10 mg total) by mouth daily.   lisinopril (ZESTRIL) 10 MG tablet Take 1 tablet (10 mg total) by mouth daily.   meloxicam (MOBIC) 15 MG tablet TAKE 1 TABLET (15 MG TOTAL) BY MOUTH DAILY.   metFORMIN (GLUCOPHAGE-XR) 500 MG 24 hr tablet TAKE 1 TABLET BY MOUTH EVERY DAY WITH BREAKFAST   omeprazole (PRILOSEC) 20 MG capsule TAKE 1 CAPSULE (20 MG TOTAL) BY MOUTH 2 (TWO) TIMES  DAILY BEFORE A MEAL.   spironolactone (ALDACTONE) 50 MG tablet TAKE 1 TABLET BY MOUTH TWICE A DAY       01/28/2023    3:17 PM 12/08/2022   10:47 AM 08/06/2022    8:34 AM 04/03/2022   10:08 AM  GAD 7 : Generalized Anxiety Score  Nervous, Anxious, on Edge 2 2 2 2   Control/stop worrying 2 3 1 1   Worry too much - different things 2 2 2 2   Trouble relaxing 2 3 2 2   Restless 2 2 2 2   Easily annoyed or irritable 2 2 1 2   Afraid - awful might happen 1 1 2 1   Total GAD 7 Score 13 15 12 12   Anxiety Difficulty Somewhat difficult Very difficult Somewhat difficult Somewhat difficult       01/28/2023    3:17 PM 12/08/2022   10:47 AM 08/06/2022    8:34 AM  Depression screen PHQ 2/9  Decreased Interest 1 2 2   Down, Depressed, Hopeless 1 1 1   PHQ - 2 Score 2 3 3   Altered sleeping 1 2 2   Tired, decreased energy 2 2 2   Change in appetite 1 3 2   Feeling bad or failure about yourself  1 2 1   Trouble concentrating 2 1 2   Moving slowly or fidgety/restless 2 2 2   Suicidal thoughts 0 0 0  PHQ-9 Score 11 15 14   Difficult doing work/chores Somewhat difficult Very difficult Somewhat difficult    BP Readings from Last 3 Encounters:  01/28/23 130/68  12/08/22 124/70  08/06/22 120/78    Physical Exam Vitals and nursing note reviewed.  Constitutional:      General: She is not in acute distress.    Appearance: Normal appearance. She is well-developed.  HENT:     Head: Normocephalic and atraumatic.     Right Ear: Tympanic membrane and ear canal normal.     Left Ear: Tympanic membrane and ear canal normal.     Nose:     Right Sinus: No maxillary sinus tenderness.     Left Sinus: No maxillary sinus tenderness.     Mouth/Throat:     Pharynx: Oropharynx is clear.  Eyes:     Extraocular Movements: Extraocular movements intact.     Right eye: No nystagmus.     Left eye: No nystagmus.  Neck:     Vascular: No carotid bruit.  Cardiovascular:     Rate and Rhythm: Normal rate and regular rhythm.   Pulmonary:     Effort: Pulmonary effort is normal. No respiratory distress.     Breath sounds: No wheezing or rhonchi.  Musculoskeletal:     Cervical back: Normal range of motion.     Right lower leg: No edema.     Left lower leg: No edema.  Lymphadenopathy:     Cervical: No cervical adenopathy.  Skin:    General: Skin is warm and dry.  Findings: No rash.  Neurological:     Mental Status: She is alert and oriented to person, place, and time.     Cranial Nerves: Cranial nerves 2-12 are intact.     Sensory: Sensation is intact.     Motor: Motor function is intact.     Coordination: Coordination is intact.  Psychiatric:        Mood and Affect: Mood normal.        Behavior: Behavior normal.     Wt Readings from Last 3 Encounters:  01/28/23 150 lb (68 kg)  12/08/22 153 lb (69.4 kg)  08/06/22 151 lb (68.5 kg)    BP 130/68   Pulse 88   Ht 5\' 2"  (1.575 m)   Wt 150 lb (68 kg)   SpO2 98%   BMI 27.44 kg/m   Assessment and Plan:  Problem List Items Addressed This Visit       Other   Generalized anxiety disorder (Chronic)    Clinically stable on current regimen with fairly good control of symptoms, No SI or HI. However she is having significant anxiety so will add Buspar 10 mg bid and continue Lexapro       Relevant Medications   busPIRone (BUSPAR) 10 MG tablet   Other Visit Diagnoses     Vertigo    -  Primary   mild symptoms with gait imbalance neuro exam is non focal recommend Eye exam and ENT evaluation   Relevant Orders   Ambulatory referral to ENT       Return in about 2 months (around 03/30/2023) for DM, Depression.   Partially dictated using [place], any errors are not intentional.  Glean Hess, MD [place], [place]

## 2023-01-28 NOTE — Assessment & Plan Note (Addendum)
Clinically stable on current regimen with fairly good control of symptoms, No SI or HI. However she is having significant anxiety so will add Buspar 10 mg bid and continue Lexapro

## 2023-02-09 ENCOUNTER — Other Ambulatory Visit: Payer: Self-pay | Admitting: Internal Medicine

## 2023-02-09 DIAGNOSIS — F411 Generalized anxiety disorder: Secondary | ICD-10-CM

## 2023-02-10 ENCOUNTER — Other Ambulatory Visit: Payer: Self-pay | Admitting: Internal Medicine

## 2023-02-10 DIAGNOSIS — F411 Generalized anxiety disorder: Secondary | ICD-10-CM

## 2023-02-11 NOTE — Telephone Encounter (Signed)
Unable to refill per protocol, Rx request is too soon.  Requested Prescriptions  Pending Prescriptions Disp Refills   busPIRone (BUSPAR) 10 MG tablet [Pharmacy Med Name: BUSPIRONE HCL 10 MG TABLET] 180 tablet 1    Sig: TAKE 1 TABLET BY MOUTH TWICE A DAY     Psychiatry: Anxiolytics/Hypnotics - Non-controlled Passed - 02/10/2023  7:05 PM      Passed - Valid encounter within last 12 months    Recent Outpatient Visits           2 weeks ago [place] at [facility], Jesse Sans, MD   2 months ago Type II diabetes mellitus with complication [facility])   [place] [facility] at [facility], Jesse Sans, MD   6 months ago Annual physical exam   [place] at [facility], Jesse Sans, MD   10 months ago Type II diabetes mellitus with complication [facility])   [place] [facility] at [facility], Jesse Sans, MD   1 year ago Essential hypertension   [place] at [facility], Jesse Sans, MD       Future Appointments             In 1 month Army Melia, Jesse Sans, MD [place] at [facility], [facility]   In 6 months Army Melia, Jesse Sans, MD [place] at [facility], [facility]

## 2023-03-30 ENCOUNTER — Ambulatory Visit: Payer: BC Managed Care – PPO | Admitting: Internal Medicine

## 2023-03-30 ENCOUNTER — Encounter: Payer: Self-pay | Admitting: Internal Medicine

## 2023-03-30 VITALS — BP 124/76 | HR 94 | Ht 62.0 in | Wt 148.0 lb

## 2023-03-30 DIAGNOSIS — I10 Essential (primary) hypertension: Secondary | ICD-10-CM

## 2023-03-30 DIAGNOSIS — E118 Type 2 diabetes mellitus with unspecified complications: Secondary | ICD-10-CM

## 2023-03-30 DIAGNOSIS — F411 Generalized anxiety disorder: Secondary | ICD-10-CM | POA: Diagnosis not present

## 2023-03-30 DIAGNOSIS — R1011 Right upper quadrant pain: Secondary | ICD-10-CM

## 2023-03-30 DIAGNOSIS — Z7984 Long term (current) use of oral hypoglycemic drugs: Secondary | ICD-10-CM

## 2023-03-30 LAB — POCT GLYCOSYLATED HEMOGLOBIN (HGB A1C): Hemoglobin A1C: 7.4 % — AB (ref 4.0–5.6)

## 2023-03-30 NOTE — Assessment & Plan Note (Signed)
Stable exam with well controlled BP.  Currently taking lisinopril and spironolactone. Tolerating medications without concerns or side effects. Will continue to recommend low sodium diet and current regimen.  

## 2023-03-30 NOTE — Progress Notes (Signed)
Date:  03/30/2023   Name:  Meagan Gordon   DOB:  12-25-1965   MRN:  [id number]   Chief Complaint: Diabetes  Diabetes She presents for her follow-up diabetic visit. She has type 2 diabetes mellitus. Her disease course has been improving. Pertinent negatives for hypoglycemia include no headaches or tremors. Pertinent negatives for diabetes include no chest pain, no fatigue, no polydipsia and no polyuria. Current diabetic treatment includes oral agent (monotherapy) (metformin).  Abdominal Pain This is a new problem. The current episode started 1 to 4 weeks ago. The onset quality is undetermined. The problem occurs daily. The problem has been unchanged. The pain is located in the RUQ. The pain is mild. The quality of the pain is colicky and cramping. Associated symptoms include nausea. Pertinent negatives include no arthralgias, dysuria, fever, headaches, hematuria or vomiting.  Hypertension This is a chronic problem. The problem is controlled. Pertinent negatives include no chest pain, headaches, palpitations or shortness of breath. Past treatments include angiotensin blockers. The current treatment provides significant improvement.    Lab Results  Component Value Date   NA 137 12/08/2022   K 4.5 12/08/2022   CO2 22 12/08/2022   GLUCOSE 176 (H) 12/08/2022   BUN 9 12/08/2022   CREATININE 0.71 12/08/2022   CALCIUM 9.8 12/08/2022   EGFR 101 12/08/2022   GFRNONAA >60 07/31/2020   Lab Results  Component Value Date   CHOL 196 08/06/2022   HDL 43 08/06/2022   LDLCALC 139 (H) 08/06/2022   TRIG 77 08/06/2022   CHOLHDL 4.6 (H) 08/06/2022   Lab Results  Component Value Date   TSH 1.950 08/06/2022   Lab Results  Component Value Date   HGBA1C 7.4 (A) 03/30/2023   Lab Results  Component Value Date   WBC 6.9 08/06/2022   HGB 13.3 08/06/2022   HCT 37.9 08/06/2022   MCV 90 08/06/2022   PLT 307 08/06/2022   Lab Results  Component Value Date   ALT 23 12/08/2022   AST 18 12/08/2022    ALKPHOS 118 12/08/2022   BILITOT 0.3 12/08/2022   No results found for: "25OHVITD2", "25OHVITD3", "VD25OH"   Review of Systems  Constitutional:  Negative for appetite change, fatigue, fever and unexpected weight change.  HENT:  Negative for tinnitus and trouble swallowing.   Eyes:  Negative for visual disturbance.  Respiratory:  Negative for cough, chest tightness and shortness of breath.   Cardiovascular:  Negative for chest pain, palpitations and leg swelling.  Gastrointestinal:  Positive for abdominal pain and nausea. Negative for vomiting.  Endocrine: Negative for polydipsia and polyuria.  Genitourinary:  Negative for dysuria and hematuria.  Musculoskeletal:  Negative for arthralgias.  Neurological:  Negative for tremors, numbness and headaches.  Psychiatric/Behavioral:  Negative for dysphoric mood.     Patient Active Problem List   Diagnosis Date Noted   Generalized anxiety disorder 12/08/2022   Environmental and seasonal allergies 12/08/2022   Essential hypertension 12/24/2021   Hyperlipidemia associated with type 2 diabetes mellitus (HCC) 10/01/2021   Neck muscle spasm 11/08/2018   PCOS (polycystic ovarian syndrome) 05/28/2018   Gastroesophageal reflux disease without esophagitis 05/28/2018   Breast mass, right 07/19/2016   Type II diabetes mellitus with complication (HCC) 06/17/2015    No Known Allergies  Past Surgical History:  Procedure Laterality Date   BREAST BIOPSY Left 09/11/2021   BENIGN MAMMARY PARENCHYMA WITH FIBROCYSTIC AND COLUMNAR CELL CHANGES   CESAREAN SECTION  2000   LEEP  1996    Social  History   Tobacco Use   Smoking status: Never   Smokeless tobacco: Never  Vaping Use   Vaping Use: Never used  Substance Use Topics   Alcohol use: Yes   Drug use: No     Medication list has been reviewed and updated.  Current Meds  Medication Sig   atorvastatin (LIPITOR) 20 MG tablet Take 1 tablet (20 mg total) by mouth daily.   busPIRone (BUSPAR)  10 MG tablet Take 1 tablet (10 mg total) by mouth 2 (two) times daily.   escitalopram (LEXAPRO) 10 MG tablet TAKE 1 TABLET BY MOUTH EVERY DAY   lisinopril (ZESTRIL) 10 MG tablet Take 1 tablet (10 mg total) by mouth daily.   meloxicam (MOBIC) 15 MG tablet TAKE 1 TABLET (15 MG TOTAL) BY MOUTH DAILY. (Patient taking differently: Take 15 mg by mouth as needed.)   metFORMIN (GLUCOPHAGE-XR) 500 MG 24 hr tablet TAKE 1 TABLET BY MOUTH EVERY DAY WITH BREAKFAST   omeprazole (PRILOSEC) 20 MG capsule TAKE 1 CAPSULE (20 MG TOTAL) BY MOUTH 2 (TWO) TIMES DAILY BEFORE A MEAL.   spironolactone (ALDACTONE) 50 MG tablet TAKE 1 TABLET BY MOUTH TWICE A DAY       03/30/2023    2:51 PM 01/28/2023    3:17 PM 12/08/2022   10:47 AM 08/06/2022    8:34 AM  GAD 7 : Generalized Anxiety Score  Nervous, Anxious, on Edge 1 2 2 2   Control/stop worrying 1 2 3 1   Worry too much - different things 1 2 2 2   Trouble relaxing 2 2 3 2   Restless 2 2 2 2   Easily annoyed or irritable 2 2 2 1   Afraid - awful might happen 2 1 1 2   Total GAD 7 Score 11 13 15 12   Anxiety Difficulty Not difficult at all Somewhat difficult Very difficult Somewhat difficult       03/30/2023    2:51 PM 01/28/2023    3:17 PM 12/08/2022   10:47 AM  Depression screen PHQ 2/9  Decreased Interest 0 1 2  Down, Depressed, Hopeless 0 1 1  PHQ - 2 Score 0 2 3  Altered sleeping 2 1 2   Tired, decreased energy 2 2 2   Change in appetite 0 1 3  Feeling bad or failure about yourself  0 1 2  Trouble concentrating 3 2 1   Moving slowly or fidgety/restless 0 2 2  Suicidal thoughts 0 0 0  PHQ-9 Score 7 11 15   Difficult doing work/chores Not difficult at all Somewhat difficult Very difficult    BP Readings from Last 3 Encounters:  03/30/23 124/76  01/28/23 130/68  12/08/22 124/70    Physical Exam Vitals and nursing note reviewed.  Constitutional:      General: She is not in acute distress.    Appearance: Normal appearance. She is well-developed.  HENT:      Head: Normocephalic and atraumatic.  Cardiovascular:     Rate and Rhythm: Normal rate and regular rhythm.     Heart sounds: No murmur heard. Pulmonary:     Effort: Pulmonary effort is normal. No respiratory distress.     Breath sounds: No wheezing or rhonchi.  Abdominal:     General: Abdomen is flat. There is no distension.     Palpations: Abdomen is soft. There is no mass.     Tenderness: There is abdominal tenderness.  Musculoskeletal:     Cervical back: Normal range of motion.     Right lower leg: No  edema.     Left lower leg: No edema.  Lymphadenopathy:     Cervical: No cervical adenopathy.  Skin:    General: Skin is warm and dry.     Findings: No rash.  Neurological:     Mental Status: She is alert and oriented to person, place, and time.  Psychiatric:        Mood and Affect: Mood normal.        Behavior: Behavior normal.     Wt Readings from Last 3 Encounters:  03/30/23 148 lb (67.1 kg)  01/28/23 150 lb (68 kg)  12/08/22 153 lb (69.4 kg)    BP 124/76   Pulse 94   Ht 5\' 2"  (1.575 m)   Wt 148 lb (67.1 kg)   SpO2 96%   BMI 27.07 kg/m   Assessment and Plan:  Problem List Items Addressed This Visit     Type II diabetes mellitus with complication (HCC) - Primary (Chronic)    Blood sugars stable without hypoglycemic symptoms or events. Currently being treated with metformin.  Last A1C was high due to temporary d/c of metformin.  Now resumed. Lab Results  Component Value Date   HGBA1C 8.2 (H) 12/08/2022  A1C is much improved today at 7.4       Relevant Orders   POCT glycosylated hemoglobin (Hb A1C) (Completed)   Essential hypertension (Chronic)    Stable exam with well controlled BP.  Currently taking lisinopril and spironolactone. Tolerating medications without concerns or side effects. Will continue to recommend low sodium diet and current regimen.       Generalized anxiety disorder (Chronic)    Clinically stable on current regimen with good  control of symptoms since Buspar added. No SI or HI.   No change in management at this time.        Other Visit Diagnoses     RUQ abdominal pain       Relevant Orders   US Abdomen Limited RUQ (LIVER/GB)       No follow-ups on file.   Partially dictated using Dragon software, any errors are not intentional.  Reubin Milan, MD [facility] [place], [place]

## 2023-03-30 NOTE — Assessment & Plan Note (Addendum)
Blood sugars stable without hypoglycemic symptoms or events. Currently being treated with metformin.  Last A1C was high due to temporary d/c of metformin.  Now resumed. Lab Results  Component Value Date   HGBA1C 8.2 (H) 12/08/2022  A1C is much improved today at 7.4

## 2023-03-30 NOTE — Assessment & Plan Note (Signed)
Clinically stable on current regimen with good control of symptoms since Buspar added. No SI or HI.   No change in management at this time.

## 2023-04-02 ENCOUNTER — Ambulatory Visit: Admission: RE | Admit: 2023-04-02 | Payer: BC Managed Care – PPO | Source: Ambulatory Visit

## 2023-04-06 ENCOUNTER — Telehealth: Payer: BC Managed Care – PPO | Admitting: Nurse Practitioner

## 2023-04-06 DIAGNOSIS — K047 Periapical abscess without sinus: Secondary | ICD-10-CM | POA: Diagnosis not present

## 2023-04-06 MED ORDER — AMOXICILLIN 400 MG/5ML PO SUSR: 500.0000 mg | Freq: Three times a day (TID) | ORAL | 0 refills | Status: AC

## 2023-04-06 MED ORDER — PENICILLIN V POTASSIUM 125 MG/5ML PO SOLR: 500.0000 mg | Freq: Three times a day (TID) | ORAL | 0 refills | Status: DC

## 2023-04-06 NOTE — Progress Notes (Signed)
E-Visit for Dental Pain  We are sorry that you are not feeling well.  Here is how we plan to help!  Based on what you have shared with me in the questionnaire, it sounds like you have an infection under one or more of your teeth.   Pen VK 500mg  3 times a day for 7 days  It is imperative that you see a dentist within 10 days of this eVisit to determine the cause of the dental pain and be sure it is adequately treated  A toothache or tooth pain is caused when the nerve in the root of a tooth or surrounding a tooth is irritated. Dental (tooth) infection, decay, injury, or loss of a tooth are the most common causes of dental pain. Pain may also occur after an extraction (tooth is pulled out). Pain sometimes originates from other areas and radiates to the jaw, thus appearing to be tooth pain.Bacteria growing inside your mouth can contribute to gum disease and dental decay, both of which can cause pain. A toothache occurs from inflammation of the central portion of the tooth called pulp. The pulp contains nerve endings that are very sensitive to pain. Inflammation to the pulp or pulpitis may be caused by dental cavities, trauma, and infection.    HOME CARE:   For toothaches: Over-the-counter pain medications such as acetaminophen or ibuprofen may be used. Take these as directed on the package while you arrange for a dental appointment. Avoid very cold or hot foods, because they may make the pain worse. You may get relief from biting on a cotton ball soaked in oil of cloves. You can get oil of cloves at most drug stores.  For jaw pain:  Aspirin may be helpful for problems in the joint of the jaw in adults. If pain happens every time you open your mouth widely, the temporomandibular joint (TMJ) may be the source of the pain. Yawning or taking a large bite of food may worsen the pain. An appointment with your doctor or dentist will help you find the cause.     GET HELP RIGHT AWAY IF:  You have a  high fever or chills If you have had a recent head or face injury and develop headache, light headedness, nausea, vomiting, or other symptoms that concern you after an injury to your face or mouth, you could have a more serious injury in addition to your dental injury. A facial rash associated with a toothache: This condition may improve with medication. Contact your doctor for them to decide what is appropriate. Any jaw pain occurring with chest pain: Although jaw pain is most commonly caused by dental disease, it is sometimes referred pain from other areas. People with heart disease, especially people who have had stents placed, people with diabetes, or those who have had heart surgery may have jaw pain as a symptom of heart attack or angina. If your jaw or tooth pain is associated with lightheadedness, sweating, or shortness of breath, you should see a doctor as soon as possible. Trouble swallowing or excessive pain or bleeding from gums: If you have a history of a weakened immune system, diabetes, or steroid use, you may be more susceptible to infections. Infections can often be more severe and extensive or caused by unusual organisms. Dental and gum infections in people with these conditions may require more aggressive treatment. An abscess may need draining or IV antibiotics, for example.  MAKE SURE YOU   Understand these instructions. Will watch your  condition. Will get help right away if you are not doing well or get worse.  Thank you for choosing an e-visit.  Your e-visit answers were reviewed by a board certified advanced clinical practitioner to complete your personal care plan. Depending upon the condition, your plan could have included both over the counter or prescription medications.  Please review your pharmacy choice. Make sure the pharmacy is open so you can pick up prescription now. If there is a problem, you may contact your provider through [employer] and have the  prescription routed to another pharmacy.  Your safety is important to [place]. If you have drug allergies check your prescription carefully.   For the next 24 hours you can use MyChart to ask questions about today's visit, request a non-urgent call back, or ask for a work or school excuse. You will get an email in the next two days asking about your experience. I hope that your e-visit has been valuable and will speed your recovery.   I spent approximately 5 minutes reviewing the patient's history, current symptoms and coordinating their care today.

## 2023-04-06 NOTE — Progress Notes (Signed)
Meds ordered this encounter  Medications   DISCONTD: penicillin potassium (VEETID) 125 MG/5ML solution    Sig: Take 20 mLs (500 mg total) by mouth 3 (three) times daily for 7 days.    Dispense:  420 mL    Refill:  0   amoxicillin (AMOXIL) 400 MG/5ML suspension    Sig: Take 6.3 mLs (500 mg total) by mouth 3 (three) times daily for 7 days.    Dispense:  132.3 mL    Refill:  0    Replacement for PCN liquid

## 2023-04-06 NOTE — Addendum Note (Signed)
Addended by: Viviano Simas E on: 04/06/2023 11:04 AM   Modules accepted: Orders

## 2023-05-14 DIAGNOSIS — M7501 Adhesive capsulitis of right shoulder: Secondary | ICD-10-CM | POA: Diagnosis not present

## 2023-05-22 DIAGNOSIS — R42 Dizziness and giddiness: Secondary | ICD-10-CM | POA: Diagnosis not present

## 2023-05-29 DIAGNOSIS — M7501 Adhesive capsulitis of right shoulder: Secondary | ICD-10-CM | POA: Diagnosis not present

## 2023-07-02 LAB — HM DIABETES EYE EXAM

## 2023-07-06 ENCOUNTER — Encounter: Payer: Self-pay | Admitting: Internal Medicine

## 2023-07-06 NOTE — Telephone Encounter (Signed)
Please review.  KP

## 2023-07-07 DIAGNOSIS — L649 Androgenic alopecia, unspecified: Secondary | ICD-10-CM | POA: Diagnosis not present

## 2023-07-07 DIAGNOSIS — L7211 Pilar cyst: Secondary | ICD-10-CM | POA: Diagnosis not present

## 2023-07-07 DIAGNOSIS — L814 Other melanin hyperpigmentation: Secondary | ICD-10-CM | POA: Diagnosis not present

## 2023-07-07 DIAGNOSIS — L578 Other skin changes due to chronic exposure to nonionizing radiation: Secondary | ICD-10-CM | POA: Diagnosis not present

## 2023-07-09 ENCOUNTER — Encounter: Payer: Self-pay | Admitting: Internal Medicine

## 2023-07-14 ENCOUNTER — Other Ambulatory Visit: Payer: Self-pay | Admitting: Internal Medicine

## 2023-07-14 DIAGNOSIS — E118 Type 2 diabetes mellitus with unspecified complications: Secondary | ICD-10-CM

## 2023-08-04 ENCOUNTER — Encounter: Payer: Self-pay | Admitting: Internal Medicine

## 2023-08-05 ENCOUNTER — Other Ambulatory Visit: Payer: Self-pay | Admitting: Internal Medicine

## 2023-08-05 DIAGNOSIS — E282 Polycystic ovarian syndrome: Secondary | ICD-10-CM

## 2023-08-05 MED ORDER — SPIRONOLACTONE 50 MG PO TABS: 50.0000 mg | ORAL_TABLET | Freq: Two times a day (BID) | ORAL | 0 refills | Status: DC

## 2023-08-05 NOTE — Telephone Encounter (Signed)
Please review. Last refill 2022.  KP

## 2023-08-10 ENCOUNTER — Encounter: Payer: Self-pay | Admitting: Internal Medicine

## 2023-08-10 ENCOUNTER — Ambulatory Visit (INDEPENDENT_AMBULATORY_CARE_PROVIDER_SITE_OTHER): Payer: BC Managed Care – PPO | Admitting: Internal Medicine

## 2023-08-10 VITALS — BP 128/76 | HR 88 | Ht 62.0 in | Wt 146.0 lb

## 2023-08-10 DIAGNOSIS — F418 Other specified anxiety disorders: Secondary | ICD-10-CM

## 2023-08-10 DIAGNOSIS — E118 Type 2 diabetes mellitus with unspecified complications: Secondary | ICD-10-CM

## 2023-08-10 DIAGNOSIS — Z1231 Encounter for screening mammogram for malignant neoplasm of breast: Secondary | ICD-10-CM

## 2023-08-10 DIAGNOSIS — Z0000 Encounter for general adult medical examination without abnormal findings: Secondary | ICD-10-CM | POA: Diagnosis not present

## 2023-08-10 DIAGNOSIS — E785 Hyperlipidemia, unspecified: Secondary | ICD-10-CM

## 2023-08-10 DIAGNOSIS — E1169 Type 2 diabetes mellitus with other specified complication: Secondary | ICD-10-CM | POA: Diagnosis not present

## 2023-08-10 DIAGNOSIS — I10 Essential (primary) hypertension: Secondary | ICD-10-CM | POA: Diagnosis not present

## 2023-08-10 DIAGNOSIS — Z1211 Encounter for screening for malignant neoplasm of colon: Secondary | ICD-10-CM

## 2023-08-10 DIAGNOSIS — Z7984 Long term (current) use of oral hypoglycemic drugs: Secondary | ICD-10-CM

## 2023-08-10 MED ORDER — ALPRAZOLAM 0.25 MG PO TABS: 0.2500 mg | ORAL_TABLET | Freq: Every day | ORAL | 0 refills | Status: DC | PRN

## 2023-08-10 NOTE — Assessment & Plan Note (Signed)
LDL is  Lab Results  Component Value Date   LDLCALC 139 (H) 08/06/2022   Currently being treated with atorvastatin with good compliance and no concerns.

## 2023-08-10 NOTE — Progress Notes (Addendum)
Date:  08/10/2023   Name:  Meagan Gordon   DOB:  03-22-1966   MRN:  [id number]   Chief Complaint: Annual Exam Meagan Gordon is a 57 y.o. female who presents today for her Complete Annual Exam. She feels well. She reports exercising - walking. She reports she is sleeping poorly. Breast complaints - none. She is having some anxiety about dental work and marital stress.  Mammogram: 10/2022 DEXA: none Colonoscopy: FIT 09/2022 Pap: 01/2022 neg/neg  Health Maintenance Due  Topic Date Due   COVID-19 Vaccine (1) Never done   Zoster Vaccines- Shingrix (1 of 2) Never done   Colonoscopy  Never done   Diabetic kidney evaluation - Urine ACR  08/07/2023    Immunization History  Administered Date(s) Administered   Influenza,inj,Quad PF,6+ Mos 09/28/2018   Influenza-Unspecified 09/30/2019   Tdap 05/28/2018    Hypertension This is a chronic problem. The problem is controlled. Pertinent negatives include no chest pain, headaches, palpitations or shortness of breath. Past treatments include beta blockers and diuretics. The current treatment provides significant improvement. There is no history of kidney disease, CAD/MI or CVA.  Hyperlipidemia This is a chronic problem. Recent lipid tests were reviewed and are high. Pertinent negatives include no chest pain or shortness of breath. Current antihyperlipidemic treatment includes statins. The current treatment provides moderate improvement of lipids.  Diabetes She presents for her follow-up diabetic visit. She has type 2 diabetes mellitus. Her disease course has been stable. Hypoglycemia symptoms include nervousness/anxiousness. Pertinent negatives for hypoglycemia include no dizziness, headaches or tremors. Pertinent negatives for diabetes include no chest pain, no fatigue, no polydipsia and no polyuria. Pertinent negatives for diabetic complications include no CVA.  Anxiety - she took Lexapro briefly but is causes stomach pain so she stopped it.  She is  not sure if she needs something else.  Most of her symptoms stem from issue with her husband. She requests the names of some counseling services.  Lab Results  Component Value Date   NA 137 12/08/2022   K 4.5 12/08/2022   CO2 22 12/08/2022   GLUCOSE 176 (H) 12/08/2022   BUN 9 12/08/2022   CREATININE 0.71 12/08/2022   CALCIUM 9.8 12/08/2022   EGFR 101 12/08/2022   GFRNONAA >60 07/31/2020   Lab Results  Component Value Date   CHOL 196 08/06/2022   HDL 43 08/06/2022   LDLCALC 139 (H) 08/06/2022   TRIG 77 08/06/2022   CHOLHDL 4.6 (H) 08/06/2022   Lab Results  Component Value Date   TSH 1.950 08/06/2022   Lab Results  Component Value Date   HGBA1C 7.4 (A) 03/30/2023   Lab Results  Component Value Date   WBC 6.9 08/06/2022   HGB 13.3 08/06/2022   HCT 37.9 08/06/2022   MCV 90 08/06/2022   PLT 307 08/06/2022   Lab Results  Component Value Date   ALT 23 12/08/2022   AST 18 12/08/2022   ALKPHOS 118 12/08/2022   BILITOT 0.3 12/08/2022   No results found for: "25OHVITD2", "25OHVITD3", "VD25OH"   Review of Systems  Constitutional:  Negative for chills, fatigue and fever.  HENT:  Negative for congestion, hearing loss, tinnitus, trouble swallowing and voice change.   Eyes:  Negative for visual disturbance.  Respiratory:  Negative for cough, chest tightness, shortness of breath and wheezing.   Cardiovascular:  Negative for chest pain, palpitations and leg swelling.  Gastrointestinal:  Negative for abdominal pain, constipation, diarrhea and vomiting.  Endocrine: Negative for polydipsia and polyuria.  Genitourinary:  Negative for dysuria, frequency, genital sores, vaginal bleeding and vaginal discharge.  Musculoskeletal:  Negative for arthralgias, gait problem and joint swelling.  Skin:  Negative for color change and rash.  Neurological:  Negative for dizziness, tremors, light-headedness and headaches.  Hematological:  Negative for adenopathy. Does not bruise/bleed easily.   Psychiatric/Behavioral:  Positive for dysphoric mood. Negative for sleep disturbance. The patient is nervous/anxious.     Patient Active Problem List   Diagnosis Date Noted   Generalized anxiety disorder 12/08/2022   Environmental and seasonal allergies 12/08/2022   Essential hypertension 12/24/2021   Hyperlipidemia associated with type 2 diabetes mellitus (HCC) 10/01/2021   Neck muscle spasm 11/08/2018   PCOS (polycystic ovarian syndrome) 05/28/2018   Gastroesophageal reflux disease without esophagitis 05/28/2018   Breast mass, right 07/19/2016   Type II diabetes mellitus with complication (HCC) 06/17/2015    No Known Allergies  Past Surgical History:  Procedure Laterality Date   BREAST BIOPSY Left 09/11/2021   BENIGN MAMMARY PARENCHYMA WITH FIBROCYSTIC AND COLUMNAR CELL CHANGES   CESAREAN SECTION  2000   LEEP  1996    Social History   Tobacco Use   Smoking status: Never   Smokeless tobacco: Never  Vaping Use   Vaping status: Never Used  Substance Use Topics   Alcohol use: Yes   Drug use: No     Medication list has been reviewed and updated.  Current Meds  Medication Sig   atorvastatin (LIPITOR) 20 MG tablet Take 1 tablet (20 mg total) by mouth daily.   lisinopril (ZESTRIL) 10 MG tablet Take 1 tablet (10 mg total) by mouth daily.   meloxicam (MOBIC) 15 MG tablet TAKE 1 TABLET (15 MG TOTAL) BY MOUTH DAILY. (Patient taking differently: Take 15 mg by mouth as needed.)   metFORMIN (GLUCOPHAGE-XR) 500 MG 24 hr tablet TAKE 1 TABLET BY MOUTH EVERY DAY WITH BREAKFAST   omeprazole (PRILOSEC) 20 MG capsule TAKE 1 CAPSULE (20 MG TOTAL) BY MOUTH 2 (TWO) TIMES DAILY BEFORE A MEAL.   spironolactone (ALDACTONE) 50 MG tablet Take 1 tablet (50 mg total) by mouth 2 (two) times daily.       08/10/2023    9:04 AM 03/30/2023    2:51 PM 01/28/2023    3:17 PM 12/08/2022   10:47 AM  GAD 7 : Generalized Anxiety Score  Nervous, Anxious, on Edge 3 1 2 2   Control/stop worrying 3 1 2 3    Worry too much - different things 3 1 2 2   Trouble relaxing 3 2 2 3   Restless 3 2 2 2   Easily annoyed or irritable 3 2 2 2   Afraid - awful might happen 3 2 1 1   Total GAD 7 Score 21 11 13 15   Anxiety Difficulty Very difficult Not difficult at all Somewhat difficult Very difficult       08/10/2023    9:02 AM 03/30/2023    2:51 PM 01/28/2023    3:17 PM  Depression screen PHQ 2/9  Decreased Interest 0 0 1  Down, Depressed, Hopeless 0 0 1  PHQ - 2 Score 0 0 2  Altered sleeping 3 2 1   Tired, decreased energy 3 2 2   Change in appetite 0 0 1  Feeling bad or failure about yourself  0 0 1  Trouble concentrating 2 3 2   Moving slowly or fidgety/restless 2 0 2  Suicidal thoughts 0 0 0  PHQ-9 Score 10 7 11   Difficult doing work/chores Somewhat difficult Not difficult at  all Somewhat difficult    BP Readings from Last 3 Encounters:  08/10/23 128/76  03/30/23 124/76  01/28/23 130/68    Physical Exam Vitals and nursing note reviewed.  Constitutional:      General: She is not in acute distress.    Appearance: She is well-developed.  HENT:     Head: Normocephalic and atraumatic.     Right Ear: Tympanic membrane and ear canal normal.     Left Ear: Tympanic membrane and ear canal normal.     Nose:     Right Sinus: No maxillary sinus tenderness.     Left Sinus: No maxillary sinus tenderness.  Eyes:     General: No scleral icterus.       Right eye: No discharge.        Left eye: No discharge.     Conjunctiva/sclera: Conjunctivae normal.  Neck:     Thyroid: No thyromegaly.     Vascular: No carotid bruit.  Cardiovascular:     Rate and Rhythm: Normal rate and regular rhythm.     Pulses: Normal pulses.     Heart sounds: Normal heart sounds.  Pulmonary:     Effort: Pulmonary effort is normal. No respiratory distress.     Breath sounds: No wheezing.  Chest:  Breasts:    Right: No mass, nipple discharge, skin change or tenderness.     Left: No mass, nipple discharge, skin change or  tenderness.  Abdominal:     General: Bowel sounds are normal.     Palpations: Abdomen is soft.     Tenderness: There is no abdominal tenderness.  Musculoskeletal:     Cervical back: Normal range of motion. No erythema.     Right lower leg: No edema.     Left lower leg: No edema.  Lymphadenopathy:     Cervical: No cervical adenopathy.  Skin:    General: Skin is warm and dry.     Capillary Refill: Capillary refill takes less than 2 seconds.     Findings: No rash.  Neurological:     General: No focal deficit present.     Mental Status: She is alert and oriented to person, place, and time.     Cranial Nerves: No cranial nerve deficit.     Sensory: No sensory deficit.     Deep Tendon Reflexes: Reflexes are normal and symmetric.  Psychiatric:        Attention and Perception: Attention normal.        Mood and Affect: Mood is anxious.        Speech: Speech normal.        Behavior: Behavior normal.        Thought Content: Thought content does not include suicidal ideation. Thought content does not include suicidal plan.    Diabetic Foot Exam - Simple   Simple Foot Form Diabetic Foot exam was performed with the following findings: Yes 08/10/2023  9:21 AM  Visual Inspection No deformities, no ulcerations, no other skin breakdown bilaterally: Yes Sensation Testing Intact to touch and monofilament testing bilaterally: Yes Pulse Check Posterior Tibialis and Dorsalis pulse intact bilaterally: Yes Comments      Wt Readings from Last 3 Encounters:  08/10/23 146 lb (66.2 kg)  03/30/23 148 lb (67.1 kg)  01/28/23 150 lb (68 kg)    BP 128/76   Pulse 88   Ht 5\' 2"  (1.575 m)   Wt 146 lb (66.2 kg)   SpO2 99%   BMI 26.70 kg/m  Assessment and Plan:  Problem List Items Addressed This Visit       Unprioritized   Essential hypertension (Chronic)    BP controlled. Continue current regimen - lisinopril and spironolactone      Relevant Orders   CBC with Differential/Platelet    Comprehensive metabolic panel   Hyperlipidemia associated with type 2 diabetes mellitus (HCC) (Chronic)    LDL is  Lab Results  Component Value Date   LDLCALC 139 (H) 08/06/2022   Currently being treated with atorvastatin with good compliance and no concerns.       Relevant Orders   Lipid panel   Type II diabetes mellitus with complication (HCC) (Chronic)    Blood sugars stable without hypoglycemic symptoms or events. Current regimen is metformin. Changes made last visit are none. Lab Results  Component Value Date   HGBA1C 7.4 (A) 03/30/2023         Relevant Orders   Comprehensive metabolic panel   Hemoglobin A1c   Microalbumin / creatinine urine ratio   Other Visit Diagnoses     Annual physical exam    -  Primary   list of counseling and psychiatric services provided   Relevant Orders   CBC with Differential/Platelet   Comprehensive metabolic panel   Hemoglobin A1c   Lipid panel   TSH   Encounter for screening mammogram for breast cancer       Relevant Orders   MM 3D SCREENING MAMMOGRAM BILATERAL BREAST   Colon cancer screening       Relevant Orders   Fecal occult blood, imunochemical   Situational anxiety       Relevant Medications   ALPRAZolam (XANAX) 0.25 MG tablet   Long term current use of oral hypoglycemic drug           Return in about 4 months (around 12/10/2023) for DM, HTN.    Reubin Milan, MD [facility] [place]

## 2023-08-10 NOTE — Patient Instructions (Addendum)
Below are places you can call and schedule an appt to see for Behavioral Health Services:   Psychiatry Locations: [employer] [facility] [place]  [phone] [facility]     [phone] [facility] [phone] [facility] [place] [phone] [employer] [facility] [place] [phone] [facility] [place] [phone] [facility]   [phone] Dr. Caryn Section      [phone]- [place]  [phone]    Looking for Counseling Services? [email or web address] Talk[employer]- Virtual Counseling BetterHelp- Virtual Counseling [email or web address]   Call [facility] to schedule your mammogram at [phone].

## 2023-08-10 NOTE — Assessment & Plan Note (Signed)
Blood sugars stable without hypoglycemic symptoms or events. Current regimen is metformin. Changes made last visit are none. Lab Results  Component Value Date   HGBA1C 7.4 (A) 03/30/2023

## 2023-08-10 NOTE — Assessment & Plan Note (Signed)
BP controlled. Continue current regimen - lisinopril and spironolactone

## 2023-08-11 LAB — COMPREHENSIVE METABOLIC PANEL
ALT: 14 [IU]/L (ref 0–32)
AST: 15 [IU]/L (ref 0–40)
Albumin: 4.4 g/dL (ref 3.8–4.9)
Alkaline Phosphatase: 114 [IU]/L (ref 44–121)
BUN/Creatinine Ratio: 12 (ref 9–23)
BUN: 9 mg/dL (ref 6–24)
Bilirubin Total: 0.3 mg/dL (ref 0.0–1.2)
CO2: 25 mmol/L (ref 20–29)
Calcium: 9.6 mg/dL (ref 8.7–10.2)
Chloride: 100 mmol/L (ref 96–106)
Creatinine, Ser: 0.76 mg/dL (ref 0.57–1.00)
Globulin, Total: 2.5 g/dL (ref 1.5–4.5)
Glucose: 149 mg/dL — ABNORMAL HIGH (ref 70–99)
Potassium: 4.3 mmol/L (ref 3.5–5.2)
Sodium: 138 mmol/L (ref 134–144)
Total Protein: 6.9 g/dL (ref 6.0–8.5)
eGFR: 92 mL/min/{1.73_m2} (ref >59)

## 2023-08-11 LAB — MICROALBUMIN / CREATININE URINE RATIO
Creatinine, Urine: 91.8 mg/dL
Microalb/Creat Ratio: 8 mg/g{creat} (ref 0–29)
Microalbumin, Urine: 7 ug/mL

## 2023-08-11 LAB — CBC WITH DIFFERENTIAL/PLATELET
Basophils Absolute: 0 10*3/uL (ref 0.0–0.2)
Basos: 0 %
EOS (ABSOLUTE): 0.1 10*3/uL (ref 0.0–0.4)
Eos: 1 %
Hematocrit: 43 % (ref 34.0–46.6)
Hemoglobin: 14.1 g/dL (ref 11.1–15.9)
Immature Grans (Abs): 0 10*3/uL (ref 0.0–0.1)
Immature Granulocytes: 0 %
Lymphocytes Absolute: 1.9 10*3/uL (ref 0.7–3.1)
Lymphs: 26 %
MCH: 30.9 pg (ref 26.6–33.0)
MCHC: 32.8 g/dL (ref 31.5–35.7)
MCV: 94 fL (ref 79–97)
Monocytes Absolute: 0.5 10*3/uL (ref 0.1–0.9)
Monocytes: 7 %
Neutrophils Absolute: 4.9 10*3/uL (ref 1.4–7.0)
Neutrophils: 66 %
Platelets: 318 10*3/uL (ref 150–450)
RBC: 4.57 x10E6/uL (ref 3.77–5.28)
RDW: 11.8 % (ref 11.7–15.4)
WBC: 7.5 10*3/uL (ref 3.4–10.8)

## 2023-08-11 LAB — LIPID PANEL
Chol/HDL Ratio: 4.1 {ratio} (ref 0.0–4.4)
Cholesterol, Total: 196 mg/dL (ref 100–199)
HDL: 48 mg/dL (ref >39)
LDL Chol Calc (NIH): 135 mg/dL — ABNORMAL HIGH (ref 0–99)
Triglycerides: 70 mg/dL (ref 0–149)
VLDL Cholesterol Cal: 13 mg/dL (ref 5–40)

## 2023-08-11 LAB — HEMOGLOBIN A1C
Est. average glucose Bld gHb Est-mCnc: 166 mg/dL
Hgb A1c MFr Bld: 7.4 % — ABNORMAL HIGH (ref 4.8–5.6)

## 2023-08-11 LAB — TSH: TSH: 1.09 u[IU]/mL (ref 0.450–4.500)

## 2023-09-15 DIAGNOSIS — Z1211 Encounter for screening for malignant neoplasm of colon: Secondary | ICD-10-CM | POA: Diagnosis not present

## 2023-09-17 LAB — FECAL OCCULT BLOOD, IMMUNOCHEMICAL: Fecal Occult Bld: NEGATIVE

## 2023-10-21 ENCOUNTER — Encounter: Payer: Self-pay | Admitting: Internal Medicine

## 2023-10-21 ENCOUNTER — Ambulatory Visit: Payer: BC Managed Care – PPO | Admitting: Internal Medicine

## 2023-10-21 VITALS — BP 116/64 | HR 85 | Ht 62.0 in | Wt 140.8 lb

## 2023-10-21 DIAGNOSIS — K219 Gastro-esophageal reflux disease without esophagitis: Secondary | ICD-10-CM

## 2023-10-21 DIAGNOSIS — R1013 Epigastric pain: Secondary | ICD-10-CM | POA: Diagnosis not present

## 2023-10-21 DIAGNOSIS — Z1211 Encounter for screening for malignant neoplasm of colon: Secondary | ICD-10-CM

## 2023-10-21 MED ORDER — PANTOPRAZOLE SODIUM 20 MG PO TBEC: 20.0000 mg | DELAYED_RELEASE_TABLET | Freq: Two times a day (BID) | ORAL | 0 refills | Status: DC

## 2023-10-21 MED ORDER — SUCRALFATE 1 G PO TABS: 1.0000 g | ORAL_TABLET | Freq: Three times a day (TID) | ORAL | 0 refills | Status: DC

## 2023-10-21 NOTE — Progress Notes (Signed)
Date:  10/21/2023   Name:  Meagan Gordon   DOB:  Apr 13, 1966   MRN:  [id number]   Chief Complaint: Abdominal Pain (Patient states stomach is burning and she has been feeling nauseous. She feels her antiacid medication works sometimes but more often not working. This has been going on for about 2 weeks maybe a little longer.)  Abdominal Pain This is a chronic problem. The problem occurs intermittently. The most recent episode lasted 8 months. The problem has been gradually worsening. The pain is located in the epigastric region and RUQ. The pain is mild. The quality of the pain is cramping, a sensation of fullness and burning. The abdominal pain does not radiate. Pertinent negatives include no anorexia, diarrhea, fever, headaches, melena, vomiting or weight loss. The pain is relieved by Nothing.  She is very anxious since mother passed away from cholangiocarcinoma last week of sudden onset. Patient had [place] RUQ ordered but did not schedule due to high copay.  Review of Systems  Constitutional:  Negative for chills, fatigue, fever and weight loss.  Respiratory:  Negative for chest tightness and shortness of breath.   Cardiovascular:  Negative for chest pain.  Gastrointestinal:  Positive for abdominal pain. Negative for anorexia, diarrhea, melena and vomiting.  Neurological:  Negative for dizziness, light-headedness and headaches.     Lab Results  Component Value Date   NA 138 08/10/2023   K 4.3 08/10/2023   CO2 25 08/10/2023   GLUCOSE 149 (H) 08/10/2023   BUN 9 08/10/2023   CREATININE 0.76 08/10/2023   CALCIUM 9.6 08/10/2023   EGFR 92 08/10/2023   GFRNONAA >60 07/31/2020   Lab Results  Component Value Date   CHOL 196 08/10/2023   HDL 48 08/10/2023   LDLCALC 135 (H) 08/10/2023   TRIG 70 08/10/2023   CHOLHDL 4.1 08/10/2023   Lab Results  Component Value Date   TSH 1.090 08/10/2023   Lab Results  Component Value Date   HGBA1C 7.4 (H) 08/10/2023   Lab Results  Component  Value Date   WBC 7.5 08/10/2023   HGB 14.1 08/10/2023   HCT 43.0 08/10/2023   MCV 94 08/10/2023   PLT 318 08/10/2023   Lab Results  Component Value Date   ALT 14 08/10/2023   AST 15 08/10/2023   ALKPHOS 114 08/10/2023   BILITOT 0.3 08/10/2023   No results found for: "25OHVITD2", "25OHVITD3", "VD25OH"   Patient Active Problem List   Diagnosis Date Noted   Generalized anxiety disorder 12/08/2022   Environmental and seasonal allergies 12/08/2022   Essential hypertension 12/24/2021   Hyperlipidemia associated with type 2 diabetes mellitus (HCC) 10/01/2021   Neck muscle spasm 11/08/2018   PCOS (polycystic ovarian syndrome) 05/28/2018   Gastroesophageal reflux disease without esophagitis 05/28/2018   Breast mass, right 07/19/2016   Type II diabetes mellitus with complication (HCC) 06/17/2015    No Known Allergies  Past Surgical History:  Procedure Laterality Date   BREAST BIOPSY Left 09/11/2021   BENIGN MAMMARY PARENCHYMA WITH FIBROCYSTIC AND COLUMNAR CELL CHANGES   CESAREAN SECTION  2000   LEEP  1996    Social History   Tobacco Use   Smoking status: Never   Smokeless tobacco: Never  Vaping Use   Vaping status: Never Used  Substance Use Topics   Alcohol use: Yes   Drug use: No     Medication list has been reviewed and updated.  Current Meds  Medication Sig   ALPRAZolam (XANAX) 0.25 MG tablet Take  1 tablet (0.25 mg total) by mouth daily as needed for anxiety (related to dental procedures).   lisinopril (ZESTRIL) 10 MG tablet Take 1 tablet (10 mg total) by mouth daily.   meloxicam (MOBIC) 15 MG tablet TAKE 1 TABLET (15 MG TOTAL) BY MOUTH DAILY. (Patient taking differently: Take 15 mg by mouth as needed.)   metFORMIN (GLUCOPHAGE-XR) 500 MG 24 hr tablet TAKE 1 TABLET BY MOUTH EVERY DAY WITH BREAKFAST   pantoprazole (PROTONIX) 20 MG tablet Take 1 tablet (20 mg total) by mouth 2 (two) times daily.   spironolactone (ALDACTONE) 50 MG tablet Take 1 tablet (50 mg total)  by mouth 2 (two) times daily.   sucralfate (CARAFATE) 1 g tablet Take 1 tablet (1 g total) by mouth 4 (four) times daily -  with meals and at bedtime.       10/21/2023    1:49 PM 08/10/2023    9:04 AM 03/30/2023    2:51 PM 01/28/2023    3:17 PM  GAD 7 : Generalized Anxiety Score  Nervous, Anxious, on Edge 3 3 1 2   Control/stop worrying 3 3 1 2   Worry too much - different things 3 3 1 2   Trouble relaxing 3 3 2 2   Restless 3 3 2 2   Easily annoyed or irritable 3 3 2 2   Afraid - awful might happen 3 3 2 1   Total GAD 7 Score 21 21 11 13   Anxiety Difficulty Somewhat difficult Very difficult Not difficult at all Somewhat difficult       10/21/2023    1:48 PM 08/10/2023    9:02 AM 03/30/2023    2:51 PM  Depression screen PHQ 2/9  Decreased Interest 3 0 0  Down, Depressed, Hopeless 3 0 0  PHQ - 2 Score 6 0 0  Altered sleeping 3 3 2   Tired, decreased energy 3 3 2   Change in appetite 3 0 0  Feeling bad or failure about yourself  3 0 0  Trouble concentrating 3 2 3   Moving slowly or fidgety/restless 3 2 0  Suicidal thoughts  0 0  PHQ-9 Score 24 10 7   Difficult doing work/chores Very difficult Somewhat difficult Not difficult at all    BP Readings from Last 3 Encounters:  10/21/23 116/64  08/10/23 128/76  03/30/23 124/76    Physical Exam Constitutional:      Appearance: She is well-developed.  Cardiovascular:     Rate and Rhythm: Normal rate and regular rhythm.  Pulmonary:     Effort: Pulmonary effort is normal.     Breath sounds: No wheezing or rhonchi.  Abdominal:     General: Abdomen is protuberant. Bowel sounds are normal.     Palpations: Abdomen is soft. There is no hepatomegaly or splenomegaly.     Tenderness: There is generalized abdominal tenderness. There is no right CVA tenderness, left CVA tenderness, guarding or rebound.  Neurological:     Mental Status: She is alert.     Wt Readings from Last 3 Encounters:  10/21/23 140 lb 12.8 oz (63.9 kg)  08/10/23 146  lb (66.2 kg)  03/30/23 148 lb (67.1 kg)    BP 116/64   Pulse 85   Ht 5\' 2"  (1.575 m)   Wt 140 lb 12.8 oz (63.9 kg)   SpO2 98%   BMI 25.75 kg/m   Assessment and Plan:  Problem List Items Addressed This Visit       Unprioritized   Gastroesophageal reflux disease without esophagitis - Primary (  Chronic)    Having more dyspepsia despite omeprazole bid. Will change to Protonix bid; add carafate qid; screening labs Get [place] for RUQ pain; consider empiric treatment for H Pylori Consider Abd/pelvic CT if [place] and/or labs abnormal Also refer to GI for Colonoscopy and EGD      Relevant Medications   sucralfate (CARAFATE) 1 g tablet   pantoprazole (PROTONIX) 20 MG tablet   Other Relevant Orders   CBC with Differential/Platelet   Comprehensive metabolic panel   Ambulatory referral to Gastroenterology   Other Visit Diagnoses     Epigastric abdominal pain       Relevant Orders   Amylase   Ambulatory referral to Gastroenterology   Colon cancer screening       Relevant Orders   Ambulatory referral to Gastroenterology       No follow-ups on file.    Reubin Milan, MD [facility] [place]

## 2023-10-21 NOTE — Assessment & Plan Note (Addendum)
Having more dyspepsia despite omeprazole bid. Will change to Protonix bid; add carafate qid; screening labs Get [place] for RUQ pain; consider empiric treatment for H Pylori Consider Abd/pelvic CT if [place] and/or labs abnormal Also refer to GI for Colonoscopy and EGD

## 2023-10-22 ENCOUNTER — Encounter: Payer: Self-pay | Admitting: Internal Medicine

## 2023-10-22 LAB — CBC WITH DIFFERENTIAL/PLATELET
Basophils Absolute: 0 10*3/uL (ref 0.0–0.2)
Basos: 1 %
EOS (ABSOLUTE): 0.1 10*3/uL (ref 0.0–0.4)
Eos: 1 %
Hematocrit: 40.9 % (ref 34.0–46.6)
Hemoglobin: 13.8 g/dL (ref 11.1–15.9)
Immature Grans (Abs): 0 10*3/uL (ref 0.0–0.1)
Immature Granulocytes: 0 %
Lymphocytes Absolute: 1.9 10*3/uL (ref 0.7–3.1)
Lymphs: 35 %
MCH: 30.9 pg (ref 26.6–33.0)
MCHC: 33.7 g/dL (ref 31.5–35.7)
MCV: 92 fL (ref 79–97)
Monocytes Absolute: 0.5 10*3/uL (ref 0.1–0.9)
Monocytes: 10 %
Neutrophils Absolute: 2.9 10*3/uL (ref 1.4–7.0)
Neutrophils: 53 %
Platelets: 342 10*3/uL (ref 150–450)
RBC: 4.46 x10E6/uL (ref 3.77–5.28)
RDW: 12.3 % (ref 11.7–15.4)
WBC: 5.5 10*3/uL (ref 3.4–10.8)

## 2023-10-22 LAB — COMPREHENSIVE METABOLIC PANEL
ALT: 19 [IU]/L (ref 0–32)
AST: 22 [IU]/L (ref 0–40)
Albumin: 4.6 g/dL (ref 3.8–4.9)
Alkaline Phosphatase: 101 [IU]/L (ref 44–121)
BUN/Creatinine Ratio: 10 (ref 9–23)
BUN: 8 mg/dL (ref 6–24)
Bilirubin Total: 0.2 mg/dL (ref 0.0–1.2)
CO2: 25 mmol/L (ref 20–29)
Calcium: 9.5 mg/dL (ref 8.7–10.2)
Chloride: 102 mmol/L (ref 96–106)
Creatinine, Ser: 0.77 mg/dL (ref 0.57–1.00)
Globulin, Total: 2.5 g/dL (ref 1.5–4.5)
Glucose: 123 mg/dL — ABNORMAL HIGH (ref 70–99)
Potassium: 3.9 mmol/L (ref 3.5–5.2)
Sodium: 141 mmol/L (ref 134–144)
Total Protein: 7.1 g/dL (ref 6.0–8.5)
eGFR: 91 mL/min/{1.73_m2} (ref >59)

## 2023-10-22 LAB — AMYLASE: Amylase: 18 U/L — ABNORMAL LOW (ref 31–110)

## 2023-10-27 ENCOUNTER — Encounter: Payer: Self-pay | Admitting: Internal Medicine

## 2023-10-29 ENCOUNTER — Ambulatory Visit
Admission: RE | Admit: 2023-10-29 | Discharge: 2023-10-29 | Disposition: A | Discharge status: Home / Self Care | Payer: BC Managed Care – PPO | Source: Ambulatory Visit | Attending: Internal Medicine | Admitting: Internal Medicine

## 2023-10-29 DIAGNOSIS — R1011 Right upper quadrant pain: Secondary | ICD-10-CM | POA: Insufficient documentation

## 2023-10-29 DIAGNOSIS — Z1231 Encounter for screening mammogram for malignant neoplasm of breast: Secondary | ICD-10-CM | POA: Diagnosis not present

## 2023-10-29 DIAGNOSIS — K7689 Other specified diseases of liver: Secondary | ICD-10-CM | POA: Diagnosis not present

## 2023-10-30 ENCOUNTER — Encounter: Payer: Self-pay | Admitting: Internal Medicine

## 2023-10-30 ENCOUNTER — Other Ambulatory Visit: Payer: Self-pay | Admitting: Internal Medicine

## 2023-10-30 DIAGNOSIS — Z800 Family history of malignant neoplasm of digestive organs: Secondary | ICD-10-CM

## 2023-10-30 DIAGNOSIS — R1013 Epigastric pain: Secondary | ICD-10-CM

## 2023-10-30 NOTE — Telephone Encounter (Signed)
Please review.  KP

## 2023-10-31 ENCOUNTER — Other Ambulatory Visit: Payer: Self-pay | Admitting: Internal Medicine

## 2023-10-31 DIAGNOSIS — E282 Polycystic ovarian syndrome: Secondary | ICD-10-CM

## 2023-11-02 NOTE — Telephone Encounter (Signed)
Requested by interface surescripts. Future visit in 1 month.  Requested Prescriptions  Pending Prescriptions Disp Refills   spironolactone (ALDACTONE) 50 MG tablet [Pharmacy Med Name: SPIRONOLACTONE 50 MG TABLET] 180 tablet 0    Sig: TAKE 1 TABLET BY MOUTH TWICE A DAY     Cardiovascular: Diuretics - Aldosterone Antagonist Passed - 11/02/2023  2:32 PM      Passed - Cr in normal range and within 180 days    Creatinine, Ser  Date Value Ref Range Status  10/21/2023 0.77 0.57 - 1.00 mg/dL Final         Passed - K in normal range and within 180 days    Potassium  Date Value Ref Range Status  10/21/2023 3.9 3.5 - 5.2 mmol/L Final         Passed - Na in normal range and within 180 days    Sodium  Date Value Ref Range Status  10/21/2023 141 134 - 144 mmol/L Final         Passed - eGFR is 30 or above and within 180 days    GFR calc Af Amer  Date Value Ref Range Status  07/31/2020 >60 >60 mL/min Final   GFR calc non Af Amer  Date Value Ref Range Status  07/31/2020 >60 >60 mL/min Final   eGFR  Date Value Ref Range Status  10/21/2023 91 >59 mL/min/1.73 Final         Passed - Last BP in normal range    BP Readings from Last 1 Encounters:  10/21/23 116/64         Passed - Valid encounter within last 6 months    Recent Outpatient Visits           1 week ago Gastroesophageal reflux disease without esophagitis   [place] [facility] at [facility], Nyoka Cowden, MD   2 months ago Annual physical exam   [facility] at [facility], Nyoka Cowden, MD   7 months ago Type II diabetes mellitus with complication [facility])   [place] [facility] at [facility], Nyoka Cowden, MD   9 months ago Vertigo   [facility] at [facility], Nyoka Cowden, MD   10 months ago Type II diabetes mellitus with complication [facility])   [place] [facility] at [facility] [facility], Nyoka Cowden, MD       Future Appointments             In 1 month Judithann Graves, Nyoka Cowden, MD [facility] at [facility], [facility]

## 2023-11-06 ENCOUNTER — Ambulatory Visit
Admission: RE | Admit: 2023-11-06 | Discharge: 2023-11-06 | Disposition: A | Discharge status: Home / Self Care | Payer: BC Managed Care – PPO | Source: Ambulatory Visit | Attending: Internal Medicine | Admitting: Internal Medicine

## 2023-11-06 DIAGNOSIS — K429 Umbilical hernia without obstruction or gangrene: Secondary | ICD-10-CM | POA: Diagnosis not present

## 2023-11-06 DIAGNOSIS — R1013 Epigastric pain: Secondary | ICD-10-CM | POA: Diagnosis not present

## 2023-11-06 DIAGNOSIS — Z800 Family history of malignant neoplasm of digestive organs: Secondary | ICD-10-CM | POA: Diagnosis not present

## 2023-11-06 MED ORDER — IOHEXOL 300 MG/ML  SOLN
100.0000 mL | Freq: Once | INTRAMUSCULAR | Status: AC | PRN
  Administered 2023-11-06: 100 mL via INTRAVENOUS

## 2023-11-18 ENCOUNTER — Encounter: Payer: Self-pay | Admitting: Internal Medicine

## 2023-11-18 ENCOUNTER — Other Ambulatory Visit: Payer: Self-pay | Admitting: Internal Medicine

## 2023-11-18 DIAGNOSIS — K219 Gastro-esophageal reflux disease without esophagitis: Secondary | ICD-10-CM

## 2023-11-18 DIAGNOSIS — R1011 Right upper quadrant pain: Secondary | ICD-10-CM

## 2023-11-18 MED ORDER — DICYCLOMINE HCL 10 MG PO CAPS: 10.0000 mg | ORAL_CAPSULE | Freq: Three times a day (TID) | ORAL | 3 refills | Status: AC

## 2023-11-19 ENCOUNTER — Encounter: Payer: Self-pay | Admitting: Internal Medicine

## 2023-11-19 NOTE — Telephone Encounter (Signed)
 Please review.  KP

## 2023-11-20 NOTE — Telephone Encounter (Signed)
 Requested Prescriptions  Pending Prescriptions Disp Refills   sucralfate  (CARAFATE ) 1 g tablet [Pharmacy Med Name: SUCRALFATE  1 GM TABLET] 120 tablet 0    Sig: TAKE 1 TABLET (1 G TOTAL) BY MOUTH 4 TIMES A DAY WITH MEALS AND AT BEDTIME     Gastroenterology: Antiacids Passed - 11/20/2023  1:30 PM      Passed - Valid encounter within last 12 months    Recent Outpatient Visits           1 month ago Gastroesophageal reflux disease without esophagitis   [place] [facility] at [facility], Leita DEL, MD   3 months ago Annual physical exam   [facility] at [facility], Leita DEL, MD   7 months ago Type II diabetes mellitus with complication [facility])   [place] [facility] at [facility], Leita DEL, MD   9 months ago Vertigo   [facility] at [facility], Leita DEL, MD   11 months ago Type II diabetes mellitus with complication [facility])   [place] [facility] Sports Medicine at [facility], Leita DEL, MD       Future Appointments             In 4 weeks Adie Leita DEL, MD [facility] Medicine at [facility], [facility]

## 2023-11-23 ENCOUNTER — Encounter: Payer: Self-pay | Admitting: Emergency Medicine

## 2023-11-23 ENCOUNTER — Ambulatory Visit
Admission: EM | Admit: 2023-11-23 | Discharge: 2023-11-23 | Disposition: A | Discharge status: Home / Self Care | Payer: BC Managed Care – PPO | Attending: Emergency Medicine | Admitting: Emergency Medicine

## 2023-11-23 ENCOUNTER — Ambulatory Visit (INDEPENDENT_AMBULATORY_CARE_PROVIDER_SITE_OTHER): Payer: BC Managed Care – PPO

## 2023-11-23 DIAGNOSIS — R3129 Other microscopic hematuria: Secondary | ICD-10-CM

## 2023-11-23 DIAGNOSIS — R1011 Right upper quadrant pain: Secondary | ICD-10-CM

## 2023-11-23 DIAGNOSIS — R1031 Right lower quadrant pain: Secondary | ICD-10-CM | POA: Diagnosis not present

## 2023-11-23 LAB — URINALYSIS, ROUTINE W REFLEX MICROSCOPIC
Bilirubin Urine: NEGATIVE
Glucose, UA: NEGATIVE mg/dL
Ketones, ur: 15 mg/dL — AB
Leukocytes,Ua: NEGATIVE
Nitrite: NEGATIVE
Protein, ur: NEGATIVE mg/dL
Specific Gravity, Urine: 1.015 (ref 1.005–1.030)
pH: 7 (ref 5.0–8.0)

## 2023-11-23 LAB — CBC WITH DIFFERENTIAL/PLATELET
Abs Immature Granulocytes: 0.02 10*3/uL (ref 0.00–0.07)
Basophils Absolute: 0 10*3/uL (ref 0.0–0.1)
Basophils Relative: 0 %
Eosinophils Absolute: 0 10*3/uL (ref 0.0–0.5)
Eosinophils Relative: 0 %
HCT: 41.9 % (ref 36.0–46.0)
Hemoglobin: 14.8 g/dL (ref 12.0–15.0)
Immature Granulocytes: 0 %
Lymphocytes Relative: 19 %
Lymphs Abs: 1.8 10*3/uL (ref 0.7–4.0)
MCH: 31 pg (ref 26.0–34.0)
MCHC: 35.3 g/dL (ref 30.0–36.0)
MCV: 87.8 fL (ref 80.0–100.0)
Monocytes Absolute: 0.4 10*3/uL (ref 0.1–1.0)
Monocytes Relative: 4 %
Neutro Abs: 7.1 10*3/uL (ref 1.7–7.7)
Neutrophils Relative %: 77 %
Platelets: 328 10*3/uL (ref 150–400)
RBC: 4.77 MIL/uL (ref 3.87–5.11)
RDW: 12 % (ref 11.5–15.5)
WBC: 9.3 10*3/uL (ref 4.0–10.5)
nRBC: 0 % (ref 0.0–0.2)

## 2023-11-23 LAB — COMPREHENSIVE METABOLIC PANEL
ALT: 15 U/L (ref 0–44)
AST: 16 U/L (ref 15–41)
Albumin: 4.7 g/dL (ref 3.5–5.0)
Alkaline Phosphatase: 99 U/L (ref 38–126)
Anion gap: 8 (ref 5–15)
BUN: 8 mg/dL (ref 6–20)
CO2: 26 mmol/L (ref 22–32)
Calcium: 9.6 mg/dL (ref 8.9–10.3)
Chloride: 102 mmol/L (ref 98–111)
Creatinine, Ser: 0.64 mg/dL (ref 0.44–1.00)
GFR, Estimated: >60 mL/min (ref >60)
Glucose, Bld: 113 mg/dL — ABNORMAL HIGH (ref 70–99)
Potassium: 3.9 mmol/L (ref 3.5–5.1)
Sodium: 136 mmol/L (ref 135–145)
Total Bilirubin: 0.4 mg/dL (ref 0.0–1.2)
Total Protein: 8.5 g/dL — ABNORMAL HIGH (ref 6.5–8.1)

## 2023-11-23 LAB — URINALYSIS, MICROSCOPIC (REFLEX)

## 2023-11-23 LAB — LIPASE, BLOOD: Lipase: 25 U/L (ref 11–51)

## 2023-11-23 MED ORDER — ONDANSETRON 8 MG PO TBDP: ORAL_TABLET | ORAL | 0 refills | Status: DC

## 2023-11-23 MED ORDER — NAPROXEN 500 MG PO TABS: 500.0000 mg | ORAL_TABLET | Freq: Two times a day (BID) | ORAL | 0 refills | Status: DC

## 2023-11-23 NOTE — Discharge Instructions (Addendum)
 Your CBC, CMP and lipase were normal.  Your urine has trace amounts of blood in it.  You could be having very small kidney stones that were not picked up on x-ray.  Your formal radiology report is pending.  We will contact you if radiology sees something different, and we need to change management.  I did not appreciate any large radiopaque kidney stones.  Sure you push plenty of extra fluids, may take Zofran  as needed for nausea, and Naprosyn  with 1000 mg of Tylenol twice a day as needed for pain.  Go to the emergency department if your pain changes, gets worse, or for other concerns.  Otherwise, please follow-up with Dr. Berglund's in several days.

## 2023-11-23 NOTE — ED Provider Notes (Signed)
 HPI  SUBJECTIVE:  Meagan Gordon is a 58 y.o. female who presents with dull, achy, intermittent, irregular right upper quadrant pain starting yesterday that last seconds.  It does not migrate or radiate.  She reports nausea, but denies fevers, vomiting, abdominal distention, anorexia, chest pain, coughing, wheezing, shortness of breath, urinary complaints, back pain, vaginal odor, bleeding.  She had some nonodorous vaginal discharge several days ago, but this has resolved.  She has never had symptoms like this before.  The car ride over here was not painful.  She had a normal bowel movement today with no change in her pain.  The pain is not getting worse, but it is not resolving.  She tried heating pad, Tylenol and ice.  No alleviating factors.  Symptoms are worse with ice.  It is not associated with eating, passing, stooling, urinating, movement.  Patient has a past medical history of PCOS, diabetes, hypertension, hypercholesterolemia, diverticulosis, UTI, pyelonephritis, is status post C-section.  No history of atrial fibrillation, mesenteric ischemia, hypercoagulability, excess alcohol or NSAID use, GI bleed, gallbladder disease, hepatitis.  Family history significant for bile duct cancer in her mom.  PCP: [facility]..  Seen by her PCP for right upper quadrant/epigastric burning pain on 12/11.  She was started on Protonix , CMP, CBC ordered and referred to GI.  She had a right upper quadrant ultrasound done on 12/19 and a CT scan on 12/27 that showed diverticulosis and a small, fat-containing umbilical hernia.  She was started on Bentyl  on 1/8.  She states that today's pain is different from this pain that she was evaluated for.  Past Medical History:  Diagnosis Date   Abnormal uterine bleeding 06/13/2015   Diabetes mellitus without complication (HCC)    GERD (gastroesophageal reflux disease)    Hypertension    PCOS (polycystic ovarian syndrome)     Past Surgical History:  Procedure  Laterality Date   BREAST BIOPSY Left 09/11/2021   BENIGN MAMMARY PARENCHYMA WITH FIBROCYSTIC AND COLUMNAR CELL CHANGES   CESAREAN SECTION  2000   LEEP  1996    Family History  Problem Relation Age of Onset   Stroke Mother    Uterine cancer Mother 29   Liver cancer Mother        and bile duct cancer   Dementia Father    Breast cancer Neg Hx     Social History   Tobacco Use   Smoking status: Never   Smokeless tobacco: Never  Vaping Use   Vaping status: Never Used  Substance Use Topics   Alcohol use: Not Currently   Drug use: No    No current facility-administered medications for this encounter.  Current Outpatient Medications:    ALPRAZolam  (XANAX ) 0.25 MG tablet, Take 1 tablet (0.25 mg total) by mouth daily as needed for anxiety (related to dental procedures)., Disp: 10 tablet, Rfl: 0   dicyclomine  (BENTYL ) 10 MG capsule, Take 1 capsule (10 mg total) by mouth 3 (three) times daily before meals., Disp: 270 capsule, Rfl: 3   naproxen  (NAPROSYN ) 500 MG tablet, Take 1 tablet (500 mg total) by mouth 2 (two) times daily., Disp: 20 tablet, Rfl: 0   omeprazole  (PRILOSEC) 20 MG capsule, TAKE 1 CAPSULE (20 MG TOTAL) BY MOUTH 2 (TWO) TIMES DAILY BEFORE A MEAL., Disp: 180 capsule, Rfl: 1   ondansetron  (ZOFRAN -ODT) 8 MG disintegrating tablet, 1/2- 1 tablet q 8 hr prn nausea, vomiting, Disp: 20 tablet, Rfl: 0   spironolactone  (ALDACTONE ) 50 MG tablet, TAKE 1 TABLET  BY MOUTH TWICE A DAY, Disp: 180 tablet, Rfl: 0   atorvastatin  (LIPITOR) 20 MG tablet, Take 1 tablet (20 mg total) by mouth daily. (Patient not taking: Reported on 10/21/2023), Disp: 90 tablet, Rfl: 1   lisinopril  (ZESTRIL ) 10 MG tablet, Take 1 tablet (10 mg total) by mouth daily., Disp: 90 tablet, Rfl: 1   metFORMIN  (GLUCOPHAGE -XR) 500 MG 24 hr tablet, TAKE 1 TABLET BY MOUTH EVERY DAY WITH BREAKFAST, Disp: 90 tablet, Rfl: 1   pantoprazole  (PROTONIX ) 20 MG tablet, Take 1 tablet (20 mg total) by mouth 2 (two) times daily., Disp:  180 tablet, Rfl: 0   sucralfate  (CARAFATE ) 1 g tablet, TAKE 1 TABLET (1 G TOTAL) BY MOUTH 4 TIMES A DAY WITH MEALS AND AT BEDTIME, Disp: 120 tablet, Rfl: 0  No Known Allergies   ROS  As noted in HPI.   Physical Exam  BP (!) 158/84 (BP Location: Left Arm)   Pulse 72   Temp 98 F (36.7 C) (Oral)   Resp 18   SpO2 100%   Constitutional: Well developed, well nourished, no acute distress Eyes:  EOMI, conjunctiva normal bilaterally HENT: Normocephalic, atraumatic,mucus membranes moist Respiratory: Normal inspiratory effort, lungs clear bilaterally. Cardiovascular: Normal rate, regular rhythm, no murmurs rubs gallops GI: nondistended.  Normal appearance, soft, questionable mild left flank tenderness with deep palpation.  No other abdominal tenderness.  Negative Rovsing, negative Murphy, negative McBurney.  Patient moving around the table comfortably.  No guarding or rebound.  Active bowel sounds. Back: Questionable very mild right CVAT. skin: No rash, skin intact Musculoskeletal: no deformities Neurologic: Alert & oriented x 3, no focal neuro deficits Psychiatric: Speech and behavior appropriate   ED Course   Medications - No data to display  Orders Placed This Encounter  Procedures   DG Abd 1 View    Standing Status:   Standing    Number of Occurrences:   1    Reason for Exam (SYMPTOM  OR DIAGNOSIS REQUIRED):   Right flank pain, rule out nephrolithiasis   CBC with Differential    Standing Status:   Standing    Number of Occurrences:   1   Comprehensive metabolic panel    Standing Status:   Standing    Number of Occurrences:   1   Lipase, blood    Standing Status:   Standing    Number of Occurrences:   1   Urinalysis, Routine w reflex microscopic -Urine, Clean Catch    Standing Status:   Standing    Number of Occurrences:   1    Specimen Source:   Urine, Clean Catch [76]   Urinalysis, Microscopic (reflex)    Standing Status:   Standing    Number of Occurrences:   1     Results for orders placed or performed during the hospital encounter of 11/23/23 (from the past 24 hours)  CBC with Differential     Status: None   Collection Time: 11/23/23  3:46 PM  Result Value Ref Range   WBC 9.3 4.0 - 10.5 K/uL   RBC 4.77 3.87 - 5.11 MIL/uL   Hemoglobin 14.8 12.0 - 15.0 g/dL   HCT 58.0 63.9 - 53.9 %   MCV 87.8 80.0 - 100.0 fL   MCH 31.0 26.0 - 34.0 pg   MCHC 35.3 30.0 - 36.0 g/dL   RDW 87.9 88.4 - 84.4 %   Platelets 328 150 - 400 K/uL   nRBC 0.0 0.0 - 0.2 %   Neutrophils  Relative % 77 %   Neutro Abs 7.1 1.7 - 7.7 K/uL   Lymphocytes Relative 19 %   Lymphs Abs 1.8 0.7 - 4.0 K/uL   Monocytes Relative 4 %   Monocytes Absolute 0.4 0.1 - 1.0 K/uL   Eosinophils Relative 0 %   Eosinophils Absolute 0.0 0.0 - 0.5 K/uL   Basophils Relative 0 %   Basophils Absolute 0.0 0.0 - 0.1 K/uL   Immature Granulocytes 0 %   Abs Immature Granulocytes 0.02 0.00 - 0.07 K/uL  Comprehensive metabolic panel     Status: Abnormal   Collection Time: 11/23/23  3:46 PM  Result Value Ref Range   Sodium 136 135 - 145 mmol/L   Potassium 3.9 3.5 - 5.1 mmol/L   Chloride 102 98 - 111 mmol/L   CO2 26 22 - 32 mmol/L   Glucose, Bld 113 (H) 70 - 99 mg/dL   BUN 8 6 - 20 mg/dL   Creatinine, Ser 9.35 0.44 - 1.00 mg/dL   Calcium  9.6 8.9 - 10.3 mg/dL   Total Protein 8.5 (H) 6.5 - 8.1 g/dL   Albumin 4.7 3.5 - 5.0 g/dL   AST 16 15 - 41 U/L   ALT 15 0 - 44 U/L   Alkaline Phosphatase 99 38 - 126 U/L   Total Bilirubin 0.4 0.0 - 1.2 mg/dL   GFR, Estimated >39 >39 mL/min   Anion gap 8 5 - 15  Lipase, blood     Status: None   Collection Time: 11/23/23  3:46 PM  Result Value Ref Range   Lipase 25 11 - 51 U/L  Urinalysis, Routine w reflex microscopic -Urine, Clean Catch     Status: Abnormal   Collection Time: 11/23/23  3:46 PM  Result Value Ref Range   Color, Urine YELLOW YELLOW   APPearance HAZY (A) CLEAR   Specific Gravity, Urine 1.015 1.005 - 1.030   pH 7.0 5.0 - 8.0   Glucose, UA  NEGATIVE NEGATIVE mg/dL   Hgb urine dipstick TRACE (A) NEGATIVE   Bilirubin Urine NEGATIVE NEGATIVE   Ketones, ur 15 (A) NEGATIVE mg/dL   Protein, ur NEGATIVE NEGATIVE mg/dL   Nitrite NEGATIVE NEGATIVE   Leukocytes,Ua NEGATIVE NEGATIVE  Urinalysis, Microscopic (reflex)     Status: Abnormal   Collection Time: 11/23/23  3:46 PM  Result Value Ref Range   RBC / HPF 0-5 0 - 5 RBC/hpf   WBC, UA 0-5 0 - 5 WBC/hpf   Bacteria, UA FEW (A) NONE SEEN   Squamous Epithelial / HPF 6-10 0 - 5 /HPF   CLINICAL DATA:  Right lower quadrant/flank pain.   EXAM: ABDOMEN - 1 VIEW   COMPARISON:  CT abdomen pelvis dated 11/06/2023.   FINDINGS: No bowel dilatation or evidence of obstruction. No free air or radiopaque calculi. The osseous structures are intact. The soft tissues are unremarkable.   IMPRESSION: Negative.     Electronically Signed   By: Vanetta Chou M.D.   On: 11/23/2023 17:05   No results found.  ED Clinical Impression  1. Abdominal pain, right upper quadrant   2. Hematuria, microscopic      ED Assessment/Plan     Outside records reviewed.  As noted in HPI.  Pt is comfortable, and her abd exam is benign, no peritoneal signs. No evidence of surgical abd. check a UA, KUB looking for nephrolithiasis, UTI, CBC, CMP and a lipase looking for gallbladder or liver pathology.  Will call patient [phone] if any of these labs  are abnormal and she requires emergent evaluation.  Lipase, CMP normal.  Normal CBC.  She has trace hematuria and some ketones.  However, I do not appreciate any large radiopaque stones.  She has a few round calcifications in the pelvis which are most likely phleboliths.  She has a few bacteria in her urine, but this is a contaminated sample.  She has no urinary complaints, so will not send this off for culture.  Doubt UTI in the absence of nitrites, esterase, WBCs.  Reviewed imaging independently.  No radiopaque stones.  Will call patient if radiology  overread differs enough from mine and we need to change management.    Reviewed radiology report.  No radiopaque stones, consistent with my read.  See radiology report for full details.  Doubt SBO, mesenteric ischemia, appendicitis, hepatitis, cholecystitis, pancreatitis, or perforated viscus. No evidence to support or suggest GYN pathology such as ovarian torsion or infection.   Unsure of the cause of the patient's pain, but it does not appear to be an emergency at this time.  Will send home with Zofran , take Naprosyn /Tylenol twice a day as needed for pain.  Strict ER return precautions given.  Discussed labs, imaging, MDM, treatment plan, and plan for follow-up with patient. Discussed sn/sx that should prompt return to the ED. patient agrees with plan.   Meds ordered this encounter  Medications   ondansetron  (ZOFRAN -ODT) 8 MG disintegrating tablet    Sig: 1/2- 1 tablet q 8 hr prn nausea, vomiting    Dispense:  20 tablet    Refill:  0   naproxen  (NAPROSYN ) 500 MG tablet    Sig: Take 1 tablet (500 mg total) by mouth 2 (two) times daily.    Dispense:  20 tablet    Refill:  0      *This clinic note was created using Scientist, clinical (histocompatibility and immunogenetics). Therefore, there may be occasional mistakes despite careful proofreading.  ?    Van Knee, MD 11/26/23 1556

## 2023-12-18 ENCOUNTER — Encounter: Payer: Self-pay | Admitting: Internal Medicine

## 2023-12-18 ENCOUNTER — Ambulatory Visit: Payer: BC Managed Care – PPO | Admitting: Internal Medicine

## 2023-12-18 VITALS — BP 126/76 | HR 83 | Ht 62.0 in | Wt 137.0 lb

## 2023-12-18 DIAGNOSIS — Z7984 Long term (current) use of oral hypoglycemic drugs: Secondary | ICD-10-CM

## 2023-12-18 DIAGNOSIS — E118 Type 2 diabetes mellitus with unspecified complications: Secondary | ICD-10-CM

## 2023-12-18 DIAGNOSIS — F411 Generalized anxiety disorder: Secondary | ICD-10-CM | POA: Diagnosis not present

## 2023-12-18 DIAGNOSIS — I10 Essential (primary) hypertension: Secondary | ICD-10-CM | POA: Diagnosis not present

## 2023-12-18 DIAGNOSIS — E1169 Type 2 diabetes mellitus with other specified complication: Secondary | ICD-10-CM

## 2023-12-18 DIAGNOSIS — Z801 Family history of malignant neoplasm of trachea, bronchus and lung: Secondary | ICD-10-CM

## 2023-12-18 DIAGNOSIS — E785 Hyperlipidemia, unspecified: Secondary | ICD-10-CM

## 2023-12-18 DIAGNOSIS — K219 Gastro-esophageal reflux disease without esophagitis: Secondary | ICD-10-CM

## 2023-12-18 LAB — POCT GLYCOSYLATED HEMOGLOBIN (HGB A1C): Hemoglobin A1C: 6.2 % — AB (ref 4.0–5.6)

## 2023-12-18 MED ORDER — OMEPRAZOLE 20 MG PO CPDR: 20.0000 mg | DELAYED_RELEASE_CAPSULE | Freq: Two times a day (BID) | ORAL | 1 refills | Status: AC

## 2023-12-18 NOTE — Assessment & Plan Note (Signed)
 Controlled BP with normal exam. Current regimen is lisinopril  and spironolactone . Will continue same medications; encourage continued reduced sodium diet.

## 2023-12-18 NOTE — Progress Notes (Signed)
 Date:  12/18/2023   Name:  Meagan Gordon   DOB:  [birth year]   MRN:  [id number]   Chief Complaint: Diabetes and Hypertension  Hypertension This is a chronic problem. The problem is controlled. Pertinent negatives include no chest pain, headaches, palpitations or shortness of breath.  Diabetes She presents for her follow-up diabetic visit. She has type 2 diabetes mellitus. Her disease course has been stable. Pertinent negatives for hypoglycemia include no headaches or tremors. Pertinent negatives for diabetes include no chest pain, no fatigue, no polydipsia and no polyuria.    Review of Systems  Constitutional:  Negative for appetite change, fatigue, fever and unexpected weight change.  HENT:  Negative for tinnitus and trouble swallowing.   Eyes:  Negative for visual disturbance.  Respiratory:  Negative for cough, chest tightness and shortness of breath.   Cardiovascular:  Negative for chest pain, palpitations and leg swelling.  Gastrointestinal:  Negative for abdominal pain.  Endocrine: Negative for polydipsia and polyuria.  Genitourinary:  Negative for dysuria and hematuria.  Musculoskeletal:  Negative for arthralgias.  Neurological:  Negative for tremors, numbness and headaches.  Psychiatric/Behavioral:  Negative for dysphoric mood.      Lab Results  Component Value Date   NA 136 11/23/2023   K 3.9 11/23/2023   CO2 26 11/23/2023   GLUCOSE 113 (H) 11/23/2023   BUN 8 11/23/2023   CREATININE 0.64 11/23/2023   CALCIUM  9.6 11/23/2023   EGFR 91 10/21/2023   GFRNONAA >60 11/23/2023   Lab Results  Component Value Date   CHOL 196 08/10/2023   HDL 48 08/10/2023   LDLCALC 135 (H) 08/10/2023   TRIG 70 08/10/2023   CHOLHDL 4.1 08/10/2023   Lab Results  Component Value Date   TSH 1.090 08/10/2023   Lab Results  Component Value Date   HGBA1C 6.2 (A) 12/18/2023   Lab Results  Component Value Date   WBC 9.3 11/23/2023   HGB 14.8 11/23/2023   HCT 41.9 11/23/2023   MCV 87.8  11/23/2023   PLT 328 11/23/2023   Lab Results  Component Value Date   ALT 15 11/23/2023   AST 16 11/23/2023   ALKPHOS 99 11/23/2023   BILITOT 0.4 11/23/2023   No results found for: MARIEN BOLLS, VD25OH   Patient Active Problem List   Diagnosis Date Noted   Generalized anxiety disorder 12/08/2022   Environmental and seasonal allergies 12/08/2022   Essential hypertension 12/24/2021   Hyperlipidemia associated with type 2 diabetes mellitus (HCC) 10/01/2021   Neck muscle spasm 11/08/2018   PCOS (polycystic ovarian syndrome) 05/28/2018   Gastroesophageal reflux disease without esophagitis 05/28/2018   Breast mass, right 07/19/2016   Type II diabetes mellitus with complication (HCC) 06/17/2015    No Known Allergies  Past Surgical History:  Procedure Laterality Date   BREAST BIOPSY Left 09/11/2021   BENIGN MAMMARY PARENCHYMA WITH FIBROCYSTIC AND COLUMNAR CELL CHANGES   CESAREAN SECTION  2000   LEEP  1996    Social History   Tobacco Use   Smoking status: Never   Smokeless tobacco: Never  Vaping Use   Vaping status: Never Used  Substance Use Topics   Alcohol use: Not Currently   Drug use: No     Medication list has been reviewed and updated.  Current Meds  Medication Sig   ALPRAZolam  (XANAX ) 0.25 MG tablet Take 1 tablet (0.25 mg total) by mouth daily as needed for anxiety (related to dental procedures).   atorvastatin  (LIPITOR) 20 MG tablet Take  1 tablet (20 mg total) by mouth daily.   dicyclomine  (BENTYL ) 10 MG capsule Take 1 capsule (10 mg total) by mouth 3 (three) times daily before meals.   lisinopril  (ZESTRIL ) 10 MG tablet Take 1 tablet (10 mg total) by mouth daily.   metFORMIN  (GLUCOPHAGE -XR) 500 MG 24 hr tablet TAKE 1 TABLET BY MOUTH EVERY DAY WITH BREAKFAST   spironolactone  (ALDACTONE ) 50 MG tablet TAKE 1 TABLET BY MOUTH TWICE A DAY   [DISCONTINUED] naproxen  (NAPROSYN ) 500 MG tablet Take 1 tablet (500 mg total) by mouth 2 (two) times daily.    [DISCONTINUED] omeprazole  (PRILOSEC) 20 MG capsule TAKE 1 CAPSULE (20 MG TOTAL) BY MOUTH 2 (TWO) TIMES DAILY BEFORE A MEAL.   [DISCONTINUED] ondansetron  (ZOFRAN -ODT) 8 MG disintegrating tablet 1/2- 1 tablet q 8 hr prn nausea, vomiting   [DISCONTINUED] pantoprazole  (PROTONIX ) 20 MG tablet Take 1 tablet (20 mg total) by mouth 2 (two) times daily.       12/18/2023    3:35 PM 10/21/2023    1:49 PM 08/10/2023    9:04 AM 03/30/2023    2:51 PM  GAD 7 : Generalized Anxiety Score  Nervous, Anxious, on Edge 2 3 3 1   Control/stop worrying 2 3 3 1   Worry too much - different things 2 3 3 1   Trouble relaxing 2 3 3 2   Restless 2 3 3 2   Easily annoyed or irritable 1 3 3 2   Afraid - awful might happen 2 3 3 2   Total GAD 7 Score 13 21 21 11   Anxiety Difficulty Somewhat difficult Somewhat difficult Very difficult Not difficult at all       12/18/2023    3:35 PM 10/21/2023    1:48 PM 08/10/2023    9:02 AM  Depression screen PHQ 2/9  Decreased Interest 1 3 0  Down, Depressed, Hopeless 2 3 0  PHQ - 2 Score 3 6 0  Altered sleeping 2 3 3   Tired, decreased energy 2 3 3   Change in appetite 3 3 0  Feeling bad or failure about yourself  1 3 0  Trouble concentrating 2 3 2   Moving slowly or fidgety/restless 2 3 2   Suicidal thoughts 0  0  PHQ-9 Score 15 24 10   Difficult doing work/chores Somewhat difficult Very difficult Somewhat difficult    BP Readings from Last 3 Encounters:  12/18/23 126/76  11/23/23 (!) 158/84  10/21/23 116/64    Physical Exam Vitals and nursing note reviewed.  Constitutional:      General: She is not in acute distress.    Appearance: She is well-developed.  HENT:     Head: Normocephalic and atraumatic.  Cardiovascular:     Rate and Rhythm: Normal rate and regular rhythm.     Pulses: Normal pulses.  Pulmonary:     Effort: Pulmonary effort is normal. No respiratory distress.  Abdominal:     General: Abdomen is flat.     Palpations: Abdomen is soft.     Tenderness:  There is no abdominal tenderness. There is no guarding or rebound.  Musculoskeletal:        General: Normal range of motion.     Cervical back: Normal range of motion.     Right lower leg: No edema.     Left lower leg: No edema.  Lymphadenopathy:     Cervical: No cervical adenopathy.  Skin:    General: Skin is warm and dry.     Capillary Refill: Capillary refill takes less than 2  seconds.     Findings: No rash.  Neurological:     General: No focal deficit present.     Mental Status: She is alert and oriented to person, place, and time.  Psychiatric:        Mood and Affect: Mood normal.        Behavior: Behavior normal.     Wt Readings from Last 3 Encounters:  12/18/23 137 lb (62.1 kg)  10/21/23 140 lb 12.8 oz (63.9 kg)  08/10/23 146 lb (66.2 kg)    BP 126/76   Pulse 83   Ht 5' 2 (1.575 m)   Wt 137 lb (62.1 kg)   SpO2 98%   BMI 25.06 kg/m   Assessment and Plan:  Problem List Items Addressed This Visit       Unprioritized   Essential hypertension - Primary (Chronic)   Controlled BP with normal exam. Current regimen is lisinopril  and spironolactone . Will continue same medications; encourage continued reduced sodium diet.       Gastroesophageal reflux disease without esophagitis (Chronic)   Relevant Medications   omeprazole  (PRILOSEC) 20 MG capsule   Generalized anxiety disorder (Chronic)   Clinically stable on no medications. No SI or HI reported. She has extensive stresses including her husband. She prefers not to be on medications at this time. No change in management at this time.       Hyperlipidemia associated with type 2 diabetes mellitus (HCC) (Chronic)   LDL is  Lab Results  Component Value Date   LDLCALC 135 (H) 08/10/2023   Current regimen is atorvastatin .  Tolerating medications well without issues.       Type II diabetes mellitus with complication (HCC) (Chronic)   Blood sugars stable without hypoglycemic symptoms or events. Currently  managed with MTF. Changes made last visit are none. She has lost a bit of weight recently due to stress. Lab Results  Component Value Date   HGBA1C 7.4 (H) 08/10/2023  A1C today = 6.2. Continue same medications but monitor weight/appetite and return if needed       Relevant Orders   POCT glycosylated hemoglobin (Hb A1C) (Completed)   Other Visit Diagnoses       Long term current use of oral hypoglycemic drug         Family history of lung cancer       since her mother died from intestinal cancer without any s/s she has been very anxious She has no risk factors for lung cancer - imaging is not indicated now       Return in about 4 months (around 04/16/2024) for HTN, DM, Depression.    Leita HILARIO Adie, MD [facility] [place]

## 2023-12-18 NOTE — Assessment & Plan Note (Addendum)
 Clinically stable on no medications. No SI or HI reported. She has extensive stresses including her husband. She prefers not to be on medications at this time. No change in management at this time.

## 2023-12-18 NOTE — Assessment & Plan Note (Addendum)
 Blood sugars stable without hypoglycemic symptoms or events. Currently managed with MTF. Changes made last visit are none. She has lost a bit of weight recently due to stress. Lab Results  Component Value Date   HGBA1C 7.4 (H) 08/10/2023  A1C today = 6.2. Continue same medications but monitor weight/appetite and return if needed

## 2023-12-18 NOTE — Assessment & Plan Note (Signed)
 LDL is  Lab Results  Component Value Date   LDLCALC 135 (H) 08/10/2023   Current regimen is atorvastatin .  Tolerating medications well without issues.

## 2023-12-22 ENCOUNTER — Encounter: Payer: Self-pay | Admitting: Internal Medicine

## 2024-01-16 ENCOUNTER — Other Ambulatory Visit: Payer: Self-pay | Admitting: Internal Medicine

## 2024-01-16 DIAGNOSIS — E118 Type 2 diabetes mellitus with unspecified complications: Secondary | ICD-10-CM

## 2024-01-17 ENCOUNTER — Other Ambulatory Visit: Payer: Self-pay | Admitting: Internal Medicine

## 2024-01-17 DIAGNOSIS — K219 Gastro-esophageal reflux disease without esophagitis: Secondary | ICD-10-CM

## 2024-01-18 NOTE — Telephone Encounter (Signed)
 Requested Prescriptions  Refused Prescriptions Disp Refills   pantoprazole (PROTONIX) 20 MG tablet [Pharmacy Med Name: PANTOPRAZOLE SOD DR 20 MG TAB] 180 tablet 0    Sig: TAKE 1 TABLET BY MOUTH TWICE A DAY     Gastroenterology: Proton Pump Inhibitors Passed - 01/18/2024  5:54 PM      Passed - Valid encounter within last 12 months    Recent Outpatient Visits           2 months ago Gastroesophageal reflux disease without esophagitis   [place] [facility] at [facility], Nyoka Cowden, MD   5 months ago Annual physical exam   [facility] at [facility], Nyoka Cowden, MD   9 months ago Type II diabetes mellitus with complication [facility])   [place] [facility] at [facility], Nyoka Cowden, MD   11 months ago Vertigo   [facility] at [facility], Nyoka Cowden, MD   1 year ago Type II diabetes mellitus with complication [facility])   [place] [facility] at [facility], Nyoka Cowden, MD       Future Appointments             In 3 months Judithann Graves, Nyoka Cowden, MD [facility] at [facility], [facility]

## 2024-01-18 NOTE — Telephone Encounter (Signed)
 Requested Prescriptions  Pending Prescriptions Disp Refills   metFORMIN (GLUCOPHAGE-XR) 500 MG 24 hr tablet [Pharmacy Med Name: METFORMIN HCL ER 500 MG TABLET] 90 tablet 0    Sig: TAKE 1 TABLET BY MOUTH EVERY DAY WITH BREAKFAST     Endocrinology:  Diabetes - Biguanides Failed - 01/18/2024  1:56 PM      Failed - B12 Level in normal range and within 720 days    No results found for: "VITAMINB12"       Passed - Cr in normal range and within 360 days    Creatinine, Ser  Date Value Ref Range Status  11/23/2023 0.64 0.44 - 1.00 mg/dL Final         Passed - HBA1C is between 0 and 7.9 and within 180 days    Hemoglobin A1C  Date Value Ref Range Status  12/18/2023 6.2 (A) 4.0 - 5.6 % Final   Hgb A1c MFr Bld  Date Value Ref Range Status  08/10/2023 7.4 (H) 4.8 - 5.6 % Final    Comment:             Prediabetes: 5.7 - 6.4          Diabetes: >6.4          Glycemic control for adults with diabetes: <7.0          Passed - eGFR in normal range and within 360 days    GFR calc Af Amer  Date Value Ref Range Status  07/31/2020 >60 >60 mL/min Final   GFR, Estimated  Date Value Ref Range Status  11/23/2023 >60 >60 mL/min Final    Comment:    (NOTE) Calculated using the CKD-EPI Creatinine Equation (2021)    eGFR  Date Value Ref Range Status  10/21/2023 91 >59 mL/min/1.73 Final         Passed - Valid encounter within last 6 months    Recent Outpatient Visits           2 months ago Gastroesophageal reflux disease without esophagitis   [place] [facility] at [facility], Nyoka Cowden, MD   5 months ago Annual physical exam   [facility] at [facility], Nyoka Cowden, MD   9 months ago Type II diabetes mellitus with complication [facility])   [place] [facility] at [facility], Nyoka Cowden, MD   11 months ago Vertigo   [place] [facility] at [facility], Nyoka Cowden, MD   1 year ago Type II diabetes mellitus with complication [facility])   [place] [facility] at [facility], Nyoka Cowden, MD       Future Appointments             In 3 months Judithann Graves Nyoka Cowden, MD [facility] at [facility], [facility]            Passed - CBC within normal limits and completed in the last 12 months    WBC  Date Value Ref Range Status  11/23/2023 9.3 4.0 - 10.5 K/uL Final   RBC  Date Value Ref Range Status  11/23/2023 4.77 3.87 - 5.11 MIL/uL Final   Hemoglobin  Date Value Ref Range Status  11/23/2023 14.8 12.0 - 15.0 g/dL Final  16/08/9603 54.0 11.1 - 15.9 g/dL Final   HCT  Date Value Ref Range Status  11/23/2023  41.9 36.0 - 46.0 % Final   Hematocrit  Date Value Ref Range Status  10/21/2023 40.9 34.0 - 46.6 % Final   MCHC  Date Value Ref Range Status  11/23/2023 35.3 30.0 - 36.0 g/dL Final   [facility]  Date Value Ref Range Status  11/23/2023 31.0 26.0 - 34.0 pg Final   MCV  Date Value Ref Range Status  11/23/2023 87.8 80.0 - 100.0 fL Final  10/21/2023 92 79 - 97 fL Final   No results found for: "PLTCOUNTKUC", "LABPLAT", "POCPLA" RDW  Date Value Ref Range Status  11/23/2023 12.0 11.5 - 15.5 % Final  10/21/2023 12.3 11.7 - 15.4 % Final

## 2024-02-10 ENCOUNTER — Other Ambulatory Visit: Payer: Self-pay | Admitting: Internal Medicine

## 2024-02-10 DIAGNOSIS — E282 Polycystic ovarian syndrome: Secondary | ICD-10-CM

## 2024-02-11 NOTE — Telephone Encounter (Signed)
 Potassium was within normal range in labs March 2025.  Requested Prescriptions  Pending Prescriptions Disp Refills   spironolactone (ALDACTONE) 50 MG tablet [Pharmacy Med Name: SPIRONOLACTONE 50 MG TABLET] 180 tablet 0    Sig: TAKE 1 TABLET BY MOUTH TWICE A DAY     Cardiovascular: Diuretics - Aldosterone Antagonist Passed - 02/11/2024 12:49 PM      Passed - Cr in normal range and within 180 days    Creatinine, Ser  Date Value Ref Range Status  11/23/2023 0.64 0.44 - 1.00 mg/dL Final         Passed - K in normal range and within 180 days    Potassium  Date Value Ref Range Status  11/23/2023 3.9 3.5 - 5.1 mmol/L Final         Passed - Na in normal range and within 180 days    Sodium  Date Value Ref Range Status  11/23/2023 136 135 - 145 mmol/L Final  10/21/2023 141 134 - 144 mmol/L Final         Passed - eGFR is 30 or above and within 180 days    GFR calc Af Amer  Date Value Ref Range Status  07/31/2020 >60 >60 mL/min Final   GFR, Estimated  Date Value Ref Range Status  11/23/2023 >60 >60 mL/min Final    Comment:    (NOTE) Calculated using the CKD-EPI Creatinine Equation (2021)    eGFR  Date Value Ref Range Status  10/21/2023 91 >59 mL/min/1.73 Final         Passed - Last BP in normal range    BP Readings from Last 1 Encounters:  12/18/23 126/76         Passed - Valid encounter within last 6 months    Recent Outpatient Visits           1 month ago Essential hypertension   [place] [facility] at [facility], Nyoka Cowden, MD       Future Appointments             In 2 months Judithann Graves Nyoka Cowden, MD [facility] at [facility], [facility]

## 2024-04-15 ENCOUNTER — Other Ambulatory Visit: Payer: Self-pay | Admitting: Internal Medicine

## 2024-04-15 DIAGNOSIS — K219 Gastro-esophageal reflux disease without esophagitis: Secondary | ICD-10-CM

## 2024-04-15 NOTE — Telephone Encounter (Signed)
 Refused Prilosec 20 mg because it's being requested too soon.

## 2024-04-18 ENCOUNTER — Ambulatory Visit: Payer: BC Managed Care – PPO | Admitting: Internal Medicine

## 2024-04-18 ENCOUNTER — Other Ambulatory Visit
Admission: RE | Admit: 2024-04-18 | Discharge: 2024-04-18 | Disposition: A | Discharge status: Home / Self Care | Attending: Internal Medicine | Admitting: Internal Medicine

## 2024-04-18 ENCOUNTER — Encounter: Payer: Self-pay | Admitting: Internal Medicine

## 2024-04-18 VITALS — BP 110/72 | HR 68 | Ht 62.0 in | Wt 144.4 lb

## 2024-04-18 DIAGNOSIS — E118 Type 2 diabetes mellitus with unspecified complications: Secondary | ICD-10-CM | POA: Insufficient documentation

## 2024-04-18 DIAGNOSIS — I10 Essential (primary) hypertension: Secondary | ICD-10-CM

## 2024-04-18 DIAGNOSIS — E785 Hyperlipidemia, unspecified: Secondary | ICD-10-CM

## 2024-04-18 DIAGNOSIS — F411 Generalized anxiety disorder: Secondary | ICD-10-CM

## 2024-04-18 DIAGNOSIS — R5383 Other fatigue: Secondary | ICD-10-CM

## 2024-04-18 DIAGNOSIS — R7303 Prediabetes: Secondary | ICD-10-CM

## 2024-04-18 LAB — COMPREHENSIVE METABOLIC PANEL WITH GFR
ALT: 16 U/L (ref 0–44)
AST: 16 U/L (ref 15–41)
Albumin: 4.2 g/dL (ref 3.5–5.0)
Alkaline Phosphatase: 95 U/L (ref 38–126)
Anion gap: 9 (ref 5–15)
BUN: 14 mg/dL (ref 6–20)
CO2: 26 mmol/L (ref 22–32)
Calcium: 9.3 mg/dL (ref 8.9–10.3)
Chloride: 101 mmol/L (ref 98–111)
Creatinine, Ser: 0.82 mg/dL (ref 0.44–1.00)
GFR, Estimated: >60 mL/min (ref >60)
Glucose, Bld: 124 mg/dL — ABNORMAL HIGH (ref 70–99)
Potassium: 3.9 mmol/L (ref 3.5–5.1)
Sodium: 136 mmol/L (ref 135–145)
Total Bilirubin: 0.3 mg/dL (ref 0.0–1.2)
Total Protein: 8 g/dL (ref 6.5–8.1)

## 2024-04-18 LAB — CBC WITH DIFFERENTIAL/PLATELET
Abs Immature Granulocytes: 0.03 10*3/uL (ref 0.00–0.07)
Basophils Absolute: 0 10*3/uL (ref 0.0–0.1)
Basophils Relative: 0 %
Eosinophils Absolute: 0.1 10*3/uL (ref 0.0–0.5)
Eosinophils Relative: 2 %
HCT: 38.4 % (ref 36.0–46.0)
Hemoglobin: 13.4 g/dL (ref 12.0–15.0)
Immature Granulocytes: 0 %
Lymphocytes Relative: 28 %
Lymphs Abs: 2.4 10*3/uL (ref 0.7–4.0)
MCH: 30.9 pg (ref 26.0–34.0)
MCHC: 34.9 g/dL (ref 30.0–36.0)
MCV: 88.5 fL (ref 80.0–100.0)
Monocytes Absolute: 0.6 10*3/uL (ref 0.1–1.0)
Monocytes Relative: 8 %
Neutro Abs: 5.1 10*3/uL (ref 1.7–7.7)
Neutrophils Relative %: 62 %
Platelets: 287 10*3/uL (ref 150–400)
RBC: 4.34 MIL/uL (ref 3.87–5.11)
RDW: 12.2 % (ref 11.5–15.5)
WBC: 8.3 10*3/uL (ref 4.0–10.5)
nRBC: 0 % (ref 0.0–0.2)

## 2024-04-18 LAB — T4, FREE: Free T4: 0.74 ng/dL (ref 0.61–1.12)

## 2024-04-18 LAB — POCT GLYCOSYLATED HEMOGLOBIN (HGB A1C): Hemoglobin A1C: 6.3 % — AB (ref 4.0–5.6)

## 2024-04-18 LAB — TSH: TSH: 1.403 u[IU]/mL (ref 0.350–4.500)

## 2024-04-18 NOTE — Assessment & Plan Note (Addendum)
 BS are running high ata times - she is not taking MTF due to side effects. A1c = 6.3 today, up only slightly from 6.2. She feels tired all the time for unclear reasons. In view of prediabetic A1Cs, will revert her Dx.

## 2024-04-18 NOTE — Assessment & Plan Note (Signed)
 BP slightly low on spironolactone  daily and lisinopril  intermittently. Will d/c lisinopril  and monitor BP

## 2024-04-18 NOTE — Progress Notes (Signed)
 Date:  04/18/2024   Name:  Meagan Gordon   DOB:  Dec 14, 1965   MRN:  [id number]   Chief Complaint: Hypertension, Diabetes, Fatigue (Patient said she wakes up feeling tired), and Medication Refill (Meloxicam  )  Hypertension This is a chronic problem. The problem is controlled. Pertinent negatives include no chest pain, headaches, palpitations or shortness of breath. Past treatments include diuretics. The current treatment provides significant improvement. There are no compliance problems.  There is no history of kidney disease, CAD/MI or CVA.  Diabetes She presents for her follow-up diabetic visit. She has type 2 (now prediabetes on no meds.) diabetes mellitus. Her disease course has been improving. Hypoglycemia symptoms include nervousness/anxiousness. Pertinent negatives for hypoglycemia include no dizziness or headaches. Associated symptoms include fatigue. Pertinent negatives for diabetes include no chest pain. There are no diabetic complications. Pertinent negatives for diabetic complications include no CVA. Current diabetic treatment includes diet. She is compliant with treatment all of the time. An ACE inhibitor/angiotensin II receptor blocker is not being taken. Eye exam is current.  Hyperlipidemia This is a chronic problem. Recent lipid tests were reviewed and are high. Pertinent negatives include no chest pain or shortness of breath. Current antihyperlipidemic treatment includes diet change.    Review of Systems  Constitutional:  Positive for fatigue. Negative for chills, fever and unexpected weight change.  HENT:  Negative for trouble swallowing.   Eyes:  Negative for visual disturbance.  Respiratory:  Negative for chest tightness, shortness of breath and wheezing.   Cardiovascular:  Negative for chest pain, palpitations and leg swelling.  Gastrointestinal:  Negative for constipation, nausea and vomiting.  Musculoskeletal:  Negative for arthralgias and gait problem.  Neurological:   Negative for dizziness, light-headedness and headaches.  Psychiatric/Behavioral:  Positive for sleep disturbance. Negative for dysphoric mood. The patient is nervous/anxious.      Lab Results  Component Value Date   NA 136 11/23/2023   K 3.9 11/23/2023   CO2 26 11/23/2023   GLUCOSE 113 (H) 11/23/2023   BUN 8 11/23/2023   CREATININE 0.64 11/23/2023   CALCIUM  9.6 11/23/2023   EGFR 91 10/21/2023   GFRNONAA >60 11/23/2023   Lab Results  Component Value Date   CHOL 196 08/10/2023   HDL 48 08/10/2023   LDLCALC 135 (H) 08/10/2023   TRIG 70 08/10/2023   CHOLHDL 4.1 08/10/2023   Lab Results  Component Value Date   TSH 1.090 08/10/2023   Lab Results  Component Value Date   HGBA1C 6.3 (A) 04/18/2024   Lab Results  Component Value Date   WBC 9.3 11/23/2023   HGB 14.8 11/23/2023   HCT 41.9 11/23/2023   MCV 87.8 11/23/2023   PLT 328 11/23/2023   Lab Results  Component Value Date   ALT 15 11/23/2023   AST 16 11/23/2023   ALKPHOS 99 11/23/2023   BILITOT 0.4 11/23/2023   No results found for: "25OHVITD2", "25OHVITD3", "VD25OH"   Patient Active Problem List   Diagnosis Date Noted   Generalized anxiety disorder 12/08/2022   Environmental and seasonal allergies 12/08/2022   Essential hypertension 12/24/2021   Mild hyperlipidemia 10/01/2021   Neck muscle spasm 11/08/2018   PCOS (polycystic ovarian syndrome) 05/28/2018   Gastroesophageal reflux disease without esophagitis 05/28/2018   Breast mass, right 07/19/2016   Prediabetes 06/17/2015    No Known Allergies  Past Surgical History:  Procedure Laterality Date   BREAST BIOPSY Left 09/11/2021   BENIGN MAMMARY PARENCHYMA WITH FIBROCYSTIC AND COLUMNAR CELL CHANGES  CESAREAN SECTION  2000   LEEP  1996    Social History   Tobacco Use   Smoking status: Never   Smokeless tobacco: Never  Vaping Use   Vaping status: Never Used  Substance Use Topics   Alcohol use: Not Currently   Drug use: No     Medication list  has been reviewed and updated.  Current Meds  Medication Sig   ALPRAZolam  (XANAX ) 0.25 MG tablet Take 1 tablet (0.25 mg total) by mouth daily as needed for anxiety (related to dental procedures).   atorvastatin  (LIPITOR) 20 MG tablet Take 1 tablet (20 mg total) by mouth daily.   dicyclomine  (BENTYL ) 10 MG capsule Take 1 capsule (10 mg total) by mouth 3 (three) times daily before meals.   metFORMIN  (GLUCOPHAGE -XR) 500 MG 24 hr tablet TAKE 1 TABLET BY MOUTH EVERY DAY WITH BREAKFAST   omeprazole  (PRILOSEC) 20 MG capsule Take 1 capsule (20 mg total) by mouth 2 (two) times daily before a meal.   spironolactone  (ALDACTONE ) 50 MG tablet TAKE 1 TABLET BY MOUTH TWICE A DAY   [DISCONTINUED] lisinopril  (ZESTRIL ) 10 MG tablet Take 1 tablet (10 mg total) by mouth daily.       04/18/2024    2:34 PM 12/18/2023    3:35 PM 10/21/2023    1:49 PM 08/10/2023    9:04 AM  GAD 7 : Generalized Anxiety Score  Nervous, Anxious, on Edge 1 2 3 3   Control/stop worrying 2 2 3 3   Worry too much - different things 2 2 3 3   Trouble relaxing 3 2 3 3   Restless 2 2 3 3   Easily annoyed or irritable 0 1 3 3   Afraid - awful might happen 2 2 3 3   Total GAD 7 Score 12 13 21 21   Anxiety Difficulty Somewhat difficult Somewhat difficult Somewhat difficult Very difficult       04/18/2024    2:33 PM 12/18/2023    3:35 PM 10/21/2023    1:48 PM  Depression screen PHQ 2/9  Decreased Interest 1 1 3   Down, Depressed, Hopeless 1 2 3   PHQ - 2 Score 2 3 6   Altered sleeping 2 2 3   Tired, decreased energy 3 2 3   Change in appetite 3 3 3   Feeling bad or failure about yourself  1 1 3   Trouble concentrating 2 2 3   Moving slowly or fidgety/restless  2 3  Suicidal thoughts 0 0   PHQ-9 Score 13 15 24   Difficult doing work/chores Very difficult Somewhat difficult Very difficult    BP Readings from Last 3 Encounters:  04/18/24 110/72  12/18/23 126/76  11/23/23 (!) 158/84      04/18/2024    2:46 PM  Results of the Epworth flowsheet   Sitting and reading 0  Watching TV 0  Sitting, inactive in a public place (e.g. a theatre or a meeting) 0  As a passenger in a car for an hour without a break 0  Lying down to rest in the afternoon when circumstances permit 1  Sitting and talking to someone 0  Sitting quietly after a lunch without alcohol 0  In a car, while stopped for a few minutes in traffic 0  Total score 1     Physical Exam Vitals and nursing note reviewed.  Constitutional:      General: She is not in acute distress.    Appearance: Normal appearance. She is well-developed.  HENT:     Head: Normocephalic and atraumatic.  Neck:     Vascular: No carotid bruit.  Cardiovascular:     Rate and Rhythm: Normal rate and regular rhythm.     Heart sounds: No murmur heard. Pulmonary:     Effort: Pulmonary effort is normal. No respiratory distress.     Breath sounds: No wheezing or rhonchi.  Musculoskeletal:     Cervical back: Normal range of motion.     Right lower leg: No edema.     Left lower leg: No edema.  Lymphadenopathy:     Cervical: No cervical adenopathy.  Skin:    General: Skin is warm and dry.     Capillary Refill: Capillary refill takes less than 2 seconds.     Findings: No rash.  Neurological:     General: No focal deficit present.     Mental Status: She is alert and oriented to person, place, and time.  Psychiatric:        Mood and Affect: Mood normal.        Behavior: Behavior normal.     Wt Readings from Last 3 Encounters:  04/18/24 144 lb 6 oz (65.5 kg)  12/18/23 137 lb (62.1 kg)  10/21/23 140 lb 12.8 oz (63.9 kg)    BP 110/72   Pulse 68   Ht 5\' 2"  (1.575 m)   Wt 144 lb 6 oz (65.5 kg)   SpO2 97%   BMI 26.41 kg/m   Assessment and Plan:  Problem List Items Addressed This Visit       Unprioritized   Essential hypertension (Chronic)   BP slightly low on spironolactone  daily and lisinopril  intermittently. Will d/c lisinopril  and monitor BP      Generalized anxiety disorder  (Chronic)   Fatigue with some depressive symptoms since no longer has parents to care for. She plans to reach out to Hospice and take advantage of counseling services.    04/18/2024    2:46 PM  Results of the Epworth flowsheet  Sitting and reading 0  Watching TV 0  Sitting, inactive in a public place (e.g. a theatre or a meeting) 0  As a passenger in a car for an hour without a break 0  Lying down to rest in the afternoon when circumstances permit 1  Sitting and talking to someone 0  Sitting quietly after a lunch without alcohol 0  In a car, while stopped for a few minutes in traffic 0  Total score 1         Prediabetes - Primary   BS are running high ata times - she is not taking MTF due to side effects. A1c = 6.3 today, up only slightly from 6.2. She feels tired all the time for unclear reasons. In view of prediabetic A1Cs, will revert her Dx.      Relevant Orders   POCT glycosylated hemoglobin (Hb A1C) (Completed)   CBC with Differential/Platelet   Comprehensive metabolic panel with GFR   Mild hyperlipidemia   Not taking atorvastatin  as prescribed. A1C has been in the prediabetic range.      Other Visit Diagnoses       Fatigue, unspecified type       Relevant Orders   CBC with Differential/Platelet   TSH + free T4       Return in about 3 months (around 07/19/2024) for preDM, HTN.    Sheron Dixons, MD [facility] [place]

## 2024-04-18 NOTE — Assessment & Plan Note (Signed)
 Fatigue with some depressive symptoms since no longer has parents to care for. She plans to reach out to Hospice and take advantage of counseling services.    04/18/2024    2:46 PM  Results of the Epworth flowsheet  Sitting and reading 0  Watching TV 0  Sitting, inactive in a public place (e.g. a theatre or a meeting) 0  As a passenger in a car for an hour without a break 0  Lying down to rest in the afternoon when circumstances permit 1  Sitting and talking to someone 0  Sitting quietly after a lunch without alcohol 0  In a car, while stopped for a few minutes in traffic 0  Total score 1

## 2024-04-18 NOTE — Assessment & Plan Note (Signed)
 Not taking atorvastatin  as prescribed. A1C has been in the prediabetic range.

## 2024-04-19 ENCOUNTER — Ambulatory Visit: Payer: Self-pay | Admitting: Internal Medicine

## 2024-06-24 ENCOUNTER — Other Ambulatory Visit: Payer: Self-pay | Admitting: Internal Medicine

## 2024-06-24 DIAGNOSIS — E282 Polycystic ovarian syndrome: Secondary | ICD-10-CM

## 2024-06-28 ENCOUNTER — Ambulatory Visit: Admitting: Internal Medicine

## 2024-06-28 ENCOUNTER — Encounter: Payer: Self-pay | Admitting: Internal Medicine

## 2024-06-28 VITALS — BP 112/60 | HR 89 | Ht 62.0 in | Wt 144.0 lb

## 2024-06-28 DIAGNOSIS — H04123 Dry eye syndrome of bilateral lacrimal glands: Secondary | ICD-10-CM

## 2024-06-28 DIAGNOSIS — R21 Rash and other nonspecific skin eruption: Secondary | ICD-10-CM

## 2024-06-28 NOTE — Telephone Encounter (Signed)
 Requested Prescriptions  Pending Prescriptions Disp Refills   spironolactone  (ALDACTONE ) 50 MG tablet [Pharmacy Med Name: SPIRONOLACTONE  50 MG TABLET] 180 tablet 0    Sig: TAKE 1 TABLET BY MOUTH TWICE A DAY     Cardiovascular: Diuretics - Aldosterone Antagonist Passed - 06/28/2024 11:07 AM      Passed - Cr in normal range and within 180 days    Creatinine, Ser  Date Value Ref Range Status  04/18/2024 0.82 0.44 - 1.00 mg/dL Final         Passed - K in normal range and within 180 days    Potassium  Date Value Ref Range Status  04/18/2024 3.9 3.5 - 5.1 mmol/L Final         Passed - Na in normal range and within 180 days    Sodium  Date Value Ref Range Status  04/18/2024 136 135 - 145 mmol/L Final  10/21/2023 141 134 - 144 mmol/L Final         Passed - eGFR is 30 or above and within 180 days    GFR calc Af Amer  Date Value Ref Range Status  07/31/2020 >60 >60 mL/min Final   GFR, Estimated  Date Value Ref Range Status  04/18/2024 >60 >60 mL/min Final    Comment:    (NOTE) Calculated using the CKD-EPI Creatinine Equation (2021)    eGFR  Date Value Ref Range Status  10/21/2023 91 >59 mL/min/1.73 Final         Passed - Last BP in normal range    BP Readings from Last 1 Encounters:  04/18/24 110/72         Passed - Valid encounter within last 6 months    Recent Outpatient Visits           2 months ago Prediabetes   [facility] at [facility], Meagan DEL, Meagan Gordon   6 months ago Essential hypertension   [facility] at [facility], Meagan DEL, Meagan Gordon       Future Appointments             In 3 weeks Meagan Meagan DEL, Meagan Gordon [facility] at [facility], [facility]

## 2024-06-28 NOTE — Patient Instructions (Signed)
 Use hydrocortisone twice a day to rash on face.  Use the moisturizing eye drops at least three times per day for the next few days.

## 2024-06-28 NOTE — Progress Notes (Signed)
 Date:  06/28/2024   Name:  Timothea Bodenheimer   DOB:  Jan 05, 1966   MRN:  [id number]   Chief Complaint: Rash (Rash on face. Itching on right cheek, but not terribly itchy. Patient did mow the grass two days ago but she does this weekly.) and Eye Pain ( Swelling under eyes (bilateral), )  Rash This is a new problem. The current episode started yesterday. The problem has been gradually worsening since onset. The affected locations include the face. Associated symptoms include eye pain. Pertinent negatives include no congestion, facial edema, fever, shortness of breath or sore throat. Past treatments include nothing.  Eye Pain  Both eyes are affected. This is a recurrent problem. Pertinent negatives include no eye discharge, eye redness or fever. She has tried eye drops for the symptoms. The treatment provided mild relief.    Review of Systems  Constitutional:  Negative for fever.  HENT:  Negative for congestion and sore throat.   Eyes:  Positive for pain. Negative for discharge, redness and visual disturbance.  Respiratory:  Negative for shortness of breath.   Cardiovascular:  Negative for chest pain.  Skin:  Positive for rash.  Psychiatric/Behavioral:  Negative for dysphoric mood and sleep disturbance. The patient is not nervous/anxious.      Lab Results  Component Value Date   NA 136 04/18/2024   K 3.9 04/18/2024   CO2 26 04/18/2024   GLUCOSE 124 (H) 04/18/2024   BUN 14 04/18/2024   CREATININE 0.82 04/18/2024   CALCIUM  9.3 04/18/2024   EGFR 91 10/21/2023   GFRNONAA >60 04/18/2024   Lab Results  Component Value Date   CHOL 196 08/10/2023   HDL 48 08/10/2023   LDLCALC 135 (H) 08/10/2023   TRIG 70 08/10/2023   CHOLHDL 4.1 08/10/2023   Lab Results  Component Value Date   TSH 1.403 04/18/2024   Lab Results  Component Value Date   HGBA1C 6.3 (A) 04/18/2024   Lab Results  Component Value Date   WBC 8.3 04/18/2024   HGB 13.4 04/18/2024   HCT 38.4 04/18/2024   MCV 88.5  04/18/2024   PLT 287 04/18/2024   Lab Results  Component Value Date   ALT 16 04/18/2024   AST 16 04/18/2024   ALKPHOS 95 04/18/2024   BILITOT 0.3 04/18/2024   No results found for: MARIEN BOLLS, VD25OH   Patient Active Problem List   Diagnosis Date Noted   Generalized anxiety disorder 12/08/2022   Environmental and seasonal allergies 12/08/2022   Essential hypertension 12/24/2021   Mild hyperlipidemia 10/01/2021   Neck muscle spasm 11/08/2018   PCOS (polycystic ovarian syndrome) 05/28/2018   Gastroesophageal reflux disease without esophagitis 05/28/2018   Breast mass, right 07/19/2016   Prediabetes 06/17/2015    No Known Allergies  Past Surgical History:  Procedure Laterality Date   BREAST BIOPSY Left 09/11/2021   BENIGN MAMMARY PARENCHYMA WITH FIBROCYSTIC AND COLUMNAR CELL CHANGES   CESAREAN SECTION  2000   LEEP  1996    Social History   Tobacco Use   Smoking status: Never   Smokeless tobacco: Never  Vaping Use   Vaping status: Never Used  Substance Use Topics   Alcohol use: Not Currently   Drug use: No     Medication list has been reviewed and updated.  Current Meds  Medication Sig   ALPRAZolam  (XANAX ) 0.25 MG tablet Take 1 tablet (0.25 mg total) by mouth daily as needed for anxiety (related to dental procedures).   atorvastatin  (LIPITOR)  20 MG tablet Take 1 tablet (20 mg total) by mouth daily.   dicyclomine  (BENTYL ) 10 MG capsule Take 1 capsule (10 mg total) by mouth 3 (three) times daily before meals.   metFORMIN  (GLUCOPHAGE -XR) 500 MG 24 hr tablet TAKE 1 TABLET BY MOUTH EVERY DAY WITH BREAKFAST   omeprazole  (PRILOSEC) 20 MG capsule Take 1 capsule (20 mg total) by mouth 2 (two) times daily before a meal.   spironolactone  (ALDACTONE ) 50 MG tablet TAKE 1 TABLET BY MOUTH TWICE A DAY       04/18/2024    2:34 PM 12/18/2023    3:35 PM 10/21/2023    1:49 PM 08/10/2023    9:04 AM  GAD 7 : Generalized Anxiety Score  Nervous, Anxious, on Edge 1 2  3 3   Control/stop worrying 2 2 3 3   Worry too much - different things 2 2 3 3   Trouble relaxing 3 2 3 3   Restless 2 2 3 3   Easily annoyed or irritable 0 1 3 3   Afraid - awful might happen 2 2 3 3   Total GAD 7 Score 12 13 21 21   Anxiety Difficulty Somewhat difficult Somewhat difficult Somewhat difficult Very difficult       04/18/2024    2:33 PM 12/18/2023    3:35 PM 10/21/2023    1:48 PM  Depression screen PHQ 2/9  Decreased Interest 1 1 3   Down, Depressed, Hopeless 1 2 3   PHQ - 2 Score 2 3 6   Altered sleeping 2 2 3   Tired, decreased energy 3 2 3   Change in appetite 3 3 3   Feeling bad or failure about yourself  1 1 3   Trouble concentrating 2 2 3   Moving slowly or fidgety/restless  2 3  Suicidal thoughts 0 0   PHQ-9 Score 13 15 24   Difficult doing work/chores Very difficult Somewhat difficult Very difficult    BP Readings from Last 3 Encounters:  06/28/24 112/60  04/18/24 110/72  12/18/23 126/76    Physical Exam Vitals and nursing note reviewed.  Constitutional:      General: She is not in acute distress.    Appearance: Normal appearance. She is well-developed.  HENT:     Head: Normocephalic and atraumatic.   Eyes:     General: Lids are normal.     Extraocular Movements: Extraocular movements intact.     Conjunctiva/sclera:     Right eye: No exudate or hemorrhage.    Left eye: No exudate or hemorrhage. Pulmonary:     Effort: Pulmonary effort is normal. No respiratory distress.  Skin:    General: Skin is warm and dry.     Findings: No rash.  Neurological:     Mental Status: She is alert and oriented to person, place, and time.  Psychiatric:        Mood and Affect: Mood normal.        Behavior: Behavior normal.     Wt Readings from Last 3 Encounters:  06/28/24 144 lb (65.3 kg)  04/18/24 144 lb 6 oz (65.5 kg)  12/18/23 137 lb (62.1 kg)    BP 112/60   Pulse 89   Ht 5' 2 (1.575 m)   Wt 144 lb (65.3 kg)   SpO2 99%   BMI 26.34 kg/m   Assessment and  Plan:  Problem List Items Addressed This Visit   None Visit Diagnoses       Rash and nonspecific skin eruption    -  Primary   likely  non venomous insect bite recommend hydrocortisone cream bid     Dry eye syndrome of both eyes       worsened by exposure to grass pollen increase moisturizing drops to tid for the short term       No follow-ups on file.    Leita HILARIO Adie, MD [facility] [place]

## 2024-07-19 ENCOUNTER — Encounter: Payer: Self-pay | Admitting: Internal Medicine

## 2024-07-19 ENCOUNTER — Ambulatory Visit: Admitting: Internal Medicine

## 2024-07-19 VITALS — BP 122/76 | HR 85 | Ht 62.0 in | Wt 145.0 lb

## 2024-07-19 DIAGNOSIS — I10 Essential (primary) hypertension: Secondary | ICD-10-CM

## 2024-07-19 DIAGNOSIS — Z1231 Encounter for screening mammogram for malignant neoplasm of breast: Secondary | ICD-10-CM

## 2024-07-19 DIAGNOSIS — G8929 Other chronic pain: Secondary | ICD-10-CM

## 2024-07-19 DIAGNOSIS — E118 Type 2 diabetes mellitus with unspecified complications: Secondary | ICD-10-CM

## 2024-07-19 DIAGNOSIS — M25511 Pain in right shoulder: Secondary | ICD-10-CM | POA: Diagnosis not present

## 2024-07-19 DIAGNOSIS — R7303 Prediabetes: Secondary | ICD-10-CM

## 2024-07-19 DIAGNOSIS — Z7984 Long term (current) use of oral hypoglycemic drugs: Secondary | ICD-10-CM

## 2024-07-19 DIAGNOSIS — Z1211 Encounter for screening for malignant neoplasm of colon: Secondary | ICD-10-CM

## 2024-07-19 LAB — POCT GLYCOSYLATED HEMOGLOBIN (HGB A1C): Hemoglobin A1C: 5.9 % — AB (ref 4.0–5.6)

## 2024-07-19 MED ORDER — MELOXICAM 15 MG PO TABS: 15.0000 mg | ORAL_TABLET | Freq: Every day | ORAL | 0 refills | Status: DC

## 2024-07-19 NOTE — Patient Instructions (Signed)
 Call [facility] to schedule your mammogram at [phone].

## 2024-07-19 NOTE — Progress Notes (Signed)
 Date:  07/19/2024   Name:  Meagan Gordon   DOB:  [birth year]   MRN:  [id number]   Chief Complaint: Diabetes and Hypertension  Hypertension This is a chronic problem. The problem is controlled. Pertinent negatives include no chest pain, headaches, palpitations or shortness of breath. Past treatments include diuretics. The current treatment provides significant improvement.  Diabetes She presents for her follow-up diabetic visit. She has type 2 diabetes mellitus. Her disease course has been stable. Pertinent negatives for hypoglycemia include no dizziness or headaches. Pertinent negatives for diabetes include no chest pain, no fatigue and no weakness. Current diabetic treatment includes oral agent (monotherapy) (MTF).    Review of Systems  Constitutional:  Negative for fatigue and unexpected weight change.  HENT:  Negative for trouble swallowing.   Eyes:  Negative for visual disturbance.  Respiratory:  Negative for cough, chest tightness, shortness of breath and wheezing.   Cardiovascular:  Negative for chest pain, palpitations and leg swelling.  Gastrointestinal:  Negative for abdominal pain, constipation and diarrhea.  Musculoskeletal:  Negative for arthralgias and myalgias.  Neurological:  Negative for dizziness, weakness, light-headedness and headaches.     Lab Results  Component Value Date   NA 136 04/18/2024   K 3.9 04/18/2024   CO2 26 04/18/2024   GLUCOSE 124 (H) 04/18/2024   BUN 14 04/18/2024   CREATININE 0.82 04/18/2024   CALCIUM  9.3 04/18/2024   EGFR 91 10/21/2023   GFRNONAA >60 04/18/2024   Lab Results  Component Value Date   CHOL 196 08/10/2023   HDL 48 08/10/2023   LDLCALC 135 (H) 08/10/2023   TRIG 70 08/10/2023   CHOLHDL 4.1 08/10/2023   Lab Results  Component Value Date   TSH 1.403 04/18/2024   Lab Results  Component Value Date   HGBA1C 5.9 (A) 07/19/2024   Lab Results  Component Value Date   WBC 8.3 04/18/2024   HGB 13.4 04/18/2024   HCT 38.4  04/18/2024   MCV 88.5 04/18/2024   PLT 287 04/18/2024   Lab Results  Component Value Date   ALT 16 04/18/2024   AST 16 04/18/2024   ALKPHOS 95 04/18/2024   BILITOT 0.3 04/18/2024   No results found for: MARIEN BOLLS, VD25OH   Patient Active Problem List   Diagnosis Date Noted   Generalized anxiety disorder 12/08/2022   Environmental and seasonal allergies 12/08/2022   Essential hypertension 12/24/2021   Mild hyperlipidemia 10/01/2021   Neck muscle spasm 11/08/2018   PCOS (polycystic ovarian syndrome) 05/28/2018   Gastroesophageal reflux disease without esophagitis 05/28/2018   Breast mass, right 07/19/2016   Prediabetes 06/17/2015    No Known Allergies  Past Surgical History:  Procedure Laterality Date   BREAST BIOPSY Left 09/11/2021   BENIGN MAMMARY PARENCHYMA WITH FIBROCYSTIC AND COLUMNAR CELL CHANGES   CESAREAN SECTION  2000   LEEP  1996    Social History   Tobacco Use   Smoking status: Never   Smokeless tobacco: Never  Vaping Use   Vaping status: Never Used  Substance Use Topics   Alcohol use: Not Currently   Drug use: No     Medication list has been reviewed and updated.  Current Meds  Medication Sig   ALPRAZolam  (XANAX ) 0.25 MG tablet Take 1 tablet (0.25 mg total) by mouth daily as needed for anxiety (related to dental procedures).   atorvastatin  (LIPITOR) 20 MG tablet Take 1 tablet (20 mg total) by mouth daily.   dicyclomine  (BENTYL ) 10 MG capsule Take 1  capsule (10 mg total) by mouth 3 (three) times daily before meals.   meloxicam  (MOBIC ) 15 MG tablet Take 1 tablet (15 mg total) by mouth daily.   omeprazole  (PRILOSEC) 20 MG capsule Take 1 capsule (20 mg total) by mouth 2 (two) times daily before a meal.   spironolactone  (ALDACTONE ) 50 MG tablet TAKE 1 TABLET BY MOUTH TWICE A DAY   [DISCONTINUED] metFORMIN  (GLUCOPHAGE -XR) 500 MG 24 hr tablet TAKE 1 TABLET BY MOUTH EVERY DAY WITH BREAKFAST       07/19/2024    2:51 PM 04/18/2024    2:34  PM 12/18/2023    3:35 PM 10/21/2023    1:49 PM  GAD 7 : Generalized Anxiety Score  Nervous, Anxious, on Edge 2 1 2 3   Control/stop worrying 2 2 2 3   Worry too much - different things 2 2 2 3   Trouble relaxing 0 3 2 3   Restless 0 2 2 3   Easily annoyed or irritable 0 0 1 3  Afraid - awful might happen 0 2 2 3   Total GAD 7 Score 6 12 13 21   Anxiety Difficulty Somewhat difficult Somewhat difficult Somewhat difficult Somewhat difficult       07/19/2024    2:51 PM 04/18/2024    2:33 PM 12/18/2023    3:35 PM  Depression screen PHQ 2/9  Decreased Interest 1 1 1   Down, Depressed, Hopeless 1 1 2   PHQ - 2 Score 2 2 3   Altered sleeping 3 2 2   Tired, decreased energy 3 3 2   Change in appetite 0 3 3  Feeling bad or failure about yourself  0 1 1  Trouble concentrating 0 2 2  Moving slowly or fidgety/restless 0  2  Suicidal thoughts 0 0 0  PHQ-9 Score 8 13 15   Difficult doing work/chores Very difficult Very difficult Somewhat difficult    BP Readings from Last 3 Encounters:  07/19/24 122/76  06/28/24 112/60  04/18/24 110/72    Physical Exam Vitals and nursing note reviewed.  Constitutional:      General: She is not in acute distress.    Appearance: She is well-developed.  HENT:     Head: Normocephalic and atraumatic.     Nose:     Right Sinus: No maxillary sinus tenderness.     Left Sinus: No maxillary sinus tenderness.  Neck:     Thyroid : No thyromegaly.     Vascular: No carotid bruit.  Cardiovascular:     Rate and Rhythm: Normal rate and regular rhythm.     Pulses: Normal pulses.     Heart sounds: Normal heart sounds.  Pulmonary:     Effort: Pulmonary effort is normal. No respiratory distress.     Breath sounds: No wheezing.  Abdominal:     General: Bowel sounds are normal.     Palpations: Abdomen is soft.     Tenderness: There is no abdominal tenderness.  Musculoskeletal:     Cervical back: Normal range of motion. No erythema.     Right lower leg: No edema.     Left  lower leg: No edema.  Lymphadenopathy:     Cervical: No cervical adenopathy.  Skin:    General: Skin is warm and dry.     Findings: No rash.  Neurological:     General: No focal deficit present.     Mental Status: She is alert and oriented to person, place, and time.     Cranial Nerves: No cranial nerve deficit.  Sensory: No sensory deficit.     Deep Tendon Reflexes: Reflexes are normal and symmetric.  Psychiatric:        Attention and Perception: Attention normal.        Mood and Affect: Mood normal.        Behavior: Behavior normal.     Wt Readings from Last 3 Encounters:  07/19/24 145 lb (65.8 kg)  06/28/24 144 lb (65.3 kg)  04/18/24 144 lb 6 oz (65.5 kg)    BP 122/76   Pulse 85   Ht 5' 2 (1.575 m)   Wt 145 lb (65.8 kg)   SpO2 98%   BMI 26.52 kg/m   Assessment and Plan:  Problem List Items Addressed This Visit       Unprioritized   Essential hypertension - Primary (Chronic)   Blood pressure is well controlled on spironolactone  only.  Lisinopril  stopped last visit due to low BP. No medication side effects noted. Plan to continue current medications.       Prediabetes   Glucoses managed with diet alone. Lab Results  Component Value Date   HGBA1C 6.3 (A) 04/18/2024  Down from 6.4. A1C today = 5.9 She tried MTF for glucoses and PCOS but did not tolerate.  Doing well currently with diet and exercise only.      Relevant Orders   POCT glycosylated hemoglobin (Hb A1C) (Completed)   Other Visit Diagnoses       Chronic right shoulder pain       Relevant Medications   meloxicam  (MOBIC ) 15 MG tablet     Encounter for screening mammogram for breast cancer       schedule at [facility]   Relevant Orders   MM 3D SCREENING MAMMOGRAM BILATERAL BREAST     Colon cancer screening       due for annual FIT she declines Colonoscopy at this time   Relevant Orders   Fecal occult blood, imunochemical     Type II diabetes mellitus with complication (HCC)            Return in about 4 months (around 11/18/2024) for CPX TOC Dr. Lemon.    Leita HILARIO Adie, MD [facility] [place]

## 2024-07-19 NOTE — Assessment & Plan Note (Addendum)
 Glucoses managed with diet alone. Lab Results  Component Value Date   HGBA1C 6.3 (A) 04/18/2024  Down from 6.4. A1C today = 5.9 She tried MTF for glucoses and PCOS but did not tolerate.  Doing well currently with diet and exercise only.

## 2024-07-19 NOTE — Assessment & Plan Note (Signed)
 Blood pressure is well controlled on spironolactone  only.  Lisinopril  stopped last visit due to low BP. No medication side effects noted. Plan to continue current medications.

## 2024-07-26 DIAGNOSIS — Z1211 Encounter for screening for malignant neoplasm of colon: Secondary | ICD-10-CM | POA: Diagnosis not present

## 2024-07-27 ENCOUNTER — Encounter: Payer: Self-pay | Admitting: Internal Medicine

## 2024-07-29 ENCOUNTER — Ambulatory Visit: Payer: Self-pay | Admitting: Internal Medicine

## 2024-07-29 LAB — FECAL OCCULT BLOOD, IMMUNOCHEMICAL: Fecal Occult Bld: NEGATIVE

## 2024-10-06 ENCOUNTER — Other Ambulatory Visit: Payer: Self-pay | Admitting: Internal Medicine

## 2024-10-06 DIAGNOSIS — G8929 Other chronic pain: Secondary | ICD-10-CM

## 2024-10-11 NOTE — Telephone Encounter (Signed)
 Requested medication (s) are due for refill today: na   Requested medication (s) are on the active medication list: yes   Last refill:  07/19/24 #30 0 refills  Future visit scheduled: yes 11/18/24  Notes to clinic:  no refills remain. Do you want to refill Rx?     Requested Prescriptions  Pending Prescriptions Disp Refills   meloxicam  (MOBIC ) 15 MG tablet [Pharmacy Med Name: MELOXICAM  15 MG TABLET] 30 tablet 0    Sig: Take 1 tablet (15 mg total) by mouth daily.     Analgesics:  COX2 Inhibitors Failed - 10/11/2024 12:00 PM      Failed - Manual Review: Labs are only required if the patient has taken medication for more than 8 weeks.      Passed - HGB in normal range and within 360 days    Hemoglobin  Date Value Ref Range Status  04/18/2024 13.4 12.0 - 15.0 g/dL Final  87/88/7975 86.1 11.1 - 15.9 g/dL Final         Passed - Cr in normal range and within 360 days    Creatinine, Ser  Date Value Ref Range Status  04/18/2024 0.82 0.44 - 1.00 mg/dL Final         Passed - HCT in normal range and within 360 days    HCT  Date Value Ref Range Status  04/18/2024 38.4 36.0 - 46.0 % Final   Hematocrit  Date Value Ref Range Status  10/21/2023 40.9 34.0 - 46.6 % Final         Passed - AST in normal range and within 360 days    AST  Date Value Ref Range Status  04/18/2024 16 15 - 41 U/L Final         Passed - ALT in normal range and within 360 days    ALT  Date Value Ref Range Status  04/18/2024 16 0 - 44 U/L Final         Passed - eGFR is 30 or above and within 360 days    GFR calc Af Amer  Date Value Ref Range Status  07/31/2020 >60 >60 mL/min Final   GFR, Estimated  Date Value Ref Range Status  04/18/2024 >60 >60 mL/min Final    Comment:    (NOTE) Calculated using the CKD-EPI Creatinine Equation (2021)    eGFR  Date Value Ref Range Status  10/21/2023 91 >59 mL/min/1.73 Final         Passed - Patient is not pregnant      Passed - Valid encounter within last 12  months    Recent Outpatient Visits           2 months ago Essential hypertension   [place] [facility] at [facility], Leita DEL, MD   3 months ago Rash and nonspecific skin eruption   [place] [facility] at [facility], Leita DEL, MD   5 months ago Prediabetes   [facility] at [facility], Leita DEL, MD   9 months ago Essential hypertension   [facility] at [facility], Leita DEL, MD

## 2024-11-18 ENCOUNTER — Encounter: Payer: Self-pay | Admitting: Student

## 2024-11-18 ENCOUNTER — Ambulatory Visit: Admitting: Student

## 2024-11-18 VITALS — BP 118/68 | HR 89 | Ht 62.0 in | Wt 146.4 lb

## 2024-11-18 DIAGNOSIS — R0609 Other forms of dyspnea: Secondary | ICD-10-CM

## 2024-11-18 DIAGNOSIS — E785 Hyperlipidemia, unspecified: Secondary | ICD-10-CM

## 2024-11-18 DIAGNOSIS — E1165 Type 2 diabetes mellitus with hyperglycemia: Secondary | ICD-10-CM

## 2024-11-18 DIAGNOSIS — E282 Polycystic ovarian syndrome: Secondary | ICD-10-CM

## 2024-11-18 DIAGNOSIS — R0789 Other chest pain: Secondary | ICD-10-CM | POA: Diagnosis not present

## 2024-11-18 DIAGNOSIS — I10 Essential (primary) hypertension: Secondary | ICD-10-CM

## 2024-11-18 NOTE — Progress Notes (Unsigned)
 "  Established Patient Office Visit  Subjective   Patient ID: Meagan Gordon, female    DOB: Nov 17, 1965  Age: 59 y.o. MRN: [id number]  Chief Complaint  Patient presents with   Establish Care   Back Pain    Comes and goes, dull pressure feeling in her chest, SOB at times, right side back pain located on the lower shoulder area, started around christmas, went away and now the pain is back     Meagan Gordon is a 59 y.o. person with medical hx listed below who presents today for chest pressure for the past few weeks associated with dyspnea. Chest pain is not related to exertion. Describes as a pressure like sensation that is worse at night. Wakes up feeling restless due to the discomfort. Radiates to the back. Denies fever, chills, palpitations, dizziness, weakness, trauma, cough, reflux, abdominal pain, n/v/d.    Patient Active Problem List   Diagnosis Date Noted   Chest discomfort 11/24/2024   Dyspnea on exertion 11/24/2024   Generalized anxiety disorder 12/08/2022   Environmental and seasonal allergies 12/08/2022   Essential hypertension 12/24/2021   Mild hyperlipidemia 10/01/2021   Neck muscle spasm 11/08/2018   PCOS (polycystic ovarian syndrome) 05/28/2018   Gastroesophageal reflux disease without esophagitis 05/28/2018   Breast mass, right 07/19/2016   T2DM (type 2 diabetes mellitus) (HCC) 06/17/2015      ROS Refer to HPI    Objective:     Outpatient Encounter Medications as of 11/18/2024  Medication Sig   dicyclomine  (BENTYL ) 10 MG capsule Take 1 capsule (10 mg total) by mouth 3 (three) times daily before meals.   meloxicam  (MOBIC ) 15 MG tablet TAKE 1 TABLET (15 MG TOTAL) BY MOUTH DAILY. (Patient taking differently: Take 15 mg by mouth as needed.)   omeprazole  (PRILOSEC) 20 MG capsule Take 1 capsule (20 mg total) by mouth 2 (two) times daily before a meal.   spironolactone  (ALDACTONE ) 50 MG tablet TAKE 1 TABLET BY MOUTH TWICE A DAY   [DISCONTINUED] ALPRAZolam  (XANAX ) 0.25  MG tablet Take 1 tablet (0.25 mg total) by mouth daily as needed for anxiety (related to dental procedures).   atorvastatin  (LIPITOR) 20 MG tablet Take 1 tablet (20 mg total) by mouth daily. (Patient not taking: Reported on 11/18/2024)   No facility-administered encounter medications on file as of 11/18/2024.    BP 118/68   Pulse 89   Ht 5' 2 (1.575 m)   Wt 146 lb 6 oz (66.4 kg)   SpO2 98%   BMI 26.77 kg/m  BP Readings from Last 3 Encounters:  11/18/24 118/68  07/19/24 122/76  06/28/24 112/60    Physical Exam Constitutional:      Appearance: Normal appearance.  HENT:     Mouth/Throat:     Mouth: Mucous membranes are moist.     Pharynx: Oropharynx is clear.  Eyes:     Extraocular Movements: Extraocular movements intact.     Conjunctiva/sclera: Conjunctivae normal.     Pupils: Pupils are equal, round, and reactive to light.  Cardiovascular:     Rate and Rhythm: Normal rate and regular rhythm.     Pulses: Normal pulses.     Heart sounds: No murmur heard.    No friction rub.     Comments: No bruit, equal radial pulses Pulmonary:     Effort: Pulmonary effort is normal.     Breath sounds: No rhonchi or rales.  Abdominal:     General: Abdomen is flat. Bowel sounds are normal. There  is no distension.     Palpations: Abdomen is soft.     Tenderness: There is no abdominal tenderness. There is no guarding or rebound.  Musculoskeletal:        General: Normal range of motion.     Right lower leg: No edema.     Left lower leg: No edema.  Skin:    General: Skin is warm and dry.     Capillary Refill: Capillary refill takes less than 2 seconds.  Neurological:     General: No focal deficit present.     Mental Status: She is alert and oriented to person, place, and time.  Psychiatric:        Mood and Affect: Mood normal.        Behavior: Behavior normal.        11/18/2024    3:36 PM 07/19/2024    2:51 PM 04/18/2024    2:33 PM  Depression screen PHQ 2/9  Decreased Interest 2 1 1    Down, Depressed, Hopeless 1 1 1   PHQ - 2 Score 3 2 2   Altered sleeping 2 3 2   Tired, decreased energy 2 3 3   Change in appetite 1 0 3  Feeling bad or failure about yourself  0 0 1  Trouble concentrating 2 0 2  Moving slowly or fidgety/restless 2 0   Suicidal thoughts 0 0 0  PHQ-9 Score 12 8  13    Difficult doing work/chores Somewhat difficult Very difficult Very difficult     Data saved with a previous flowsheet row definition       11/18/2024    3:36 PM 07/19/2024    2:51 PM 04/18/2024    2:34 PM 12/18/2023    3:35 PM  GAD 7 : Generalized Anxiety Score  Nervous, Anxious, on Edge 2 2 1 2   Control/stop worrying 2 2 2 2   Worry too much - different things 3 2 2 2   Trouble relaxing 3 0 3 2  Restless 3 0 2 2  Easily annoyed or irritable 1 0 0 1  Afraid - awful might happen 2 0 2 2  Total GAD 7 Score 16 6 12 13   Anxiety Difficulty Somewhat difficult Somewhat difficult Somewhat difficult Somewhat difficult    Results for orders placed or performed in visit on 11/18/24  HgB A1c  Result Value Ref Range   Hgb A1c MFr Bld 6.7 (H) 4.8 - 5.6 %   Est. average glucose Bld gHb Est-mCnc 146 mg/dL  Comprehensive Metabolic Panel (CMET)  Result Value Ref Range   Glucose 117 (H) 70 - 99 mg/dL   BUN 10 6 - 24 mg/dL   Creatinine, Ser 9.18 0.57 - 1.00 mg/dL   eGFR 85 >40 fO/fpw/8.26   BUN/Creatinine Ratio 12 9 - 23   Sodium 139 134 - 144 mmol/L   Potassium 4.5 3.5 - 5.2 mmol/L   Chloride 99 96 - 106 mmol/L   CO2 26 20 - 29 mmol/L   Calcium  10.1 8.7 - 10.2 mg/dL   Total Protein 7.8 6.0 - 8.5 g/dL   Albumin 4.6 3.8 - 4.9 g/dL   Globulin, Total 3.2 1.5 - 4.5 g/dL   Bilirubin Total 0.4 0.0 - 1.2 mg/dL   Alkaline Phosphatase 126 49 - 135 IU/L   AST 18 0 - 40 IU/L   ALT 16 0 - 32 IU/L  Lipid Profile  Result Value Ref Range   Cholesterol, Total 221 (H) 100 - 199 mg/dL   Triglycerides 898 0 -  149 mg/dL   HDL 50 >60 mg/dL   VLDL Cholesterol Cal 18 5 - 40 mg/dL   LDL Chol Calc (NIH) 846 (H) 0 -  99 mg/dL   Chol/HDL Ratio 4.4 0.0 - 4.4 ratio    Last CBC Lab Results  Component Value Date   WBC 8.3 04/18/2024   HGB 13.4 04/18/2024   HCT 38.4 04/18/2024   MCV 88.5 04/18/2024   MCH 30.9 04/18/2024   RDW 12.2 04/18/2024   PLT 287 04/18/2024   Last metabolic panel Lab Results  Component Value Date   GLUCOSE 117 (H) 11/18/2024   NA 139 11/18/2024   K 4.5 11/18/2024   CL 99 11/18/2024   CO2 26 11/18/2024   BUN 10 11/18/2024   CREATININE 0.81 11/18/2024   EGFR 85 11/18/2024   CALCIUM  10.1 11/18/2024   PROT 7.8 11/18/2024   ALBUMIN 4.6 11/18/2024   LABGLOB 3.2 11/18/2024   AGRATIO 1.6 12/08/2022   BILITOT 0.4 11/18/2024   ALKPHOS 126 11/18/2024   AST 18 11/18/2024   ALT 16 11/18/2024   ANIONGAP 9 04/18/2024   Last lipids Lab Results  Component Value Date   CHOL 221 (H) 11/18/2024   HDL 50 11/18/2024   LDLCALC 153 (H) 11/18/2024   TRIG 101 11/18/2024   CHOLHDL 4.4 11/18/2024   Last hemoglobin A1c Lab Results  Component Value Date   HGBA1C 6.7 (H) 11/18/2024   Last thyroid  functions Lab Results  Component Value Date   TSH 1.403 04/18/2024   FREET4 0.74 04/18/2024      The 10-year ASCVD risk score (Arnett DK, et al., 2019) is: 5.8%    Assessment & Plan:  Chest discomfort Assessment & Plan: ECG with rate of 72, sinus rhythm, short PR interval, no acute ischemic changes, appears c/w the ECG on 12/20/2015. Labs ordered obtain echo.   Orders: -     EKG 12-Lead -     ECHOCARDIOGRAM COMPLETE; Future  PCOS (polycystic ovarian syndrome) Assessment & Plan: Currently on spironolactone .    Essential hypertension Assessment & Plan: BP well controlled on spironolactone  50 mg twice daily    Orders: -     Comprehensive metabolic panel with GFR  Type 2 diabetes mellitus with hyperglycemia, without long-term current use of insulin (HCC) Assessment & Plan: Currently diet controlled, reports fasting glucoses in the 130s. Had been eating less healthy and  exercise less during the holidays. Will work on lifestyle modifications.   Orders: -     Hemoglobin A1c  Mild hyperlipidemia Assessment & Plan: Not taking atorvastatin  regularly. Lipid profile.   Orders: -     Lipid panel  Dyspnea on exertion -     ECHOCARDIOGRAM COMPLETE; Future     Return in about 4 months (around 03/18/2025) for DM.    Harlene Saddler, MD "

## 2024-11-18 NOTE — Assessment & Plan Note (Signed)
 BP well controlled on spironolactone  50 mg twice daily

## 2024-11-18 NOTE — Assessment & Plan Note (Signed)
Currently on spironolactone.

## 2024-11-18 NOTE — Assessment & Plan Note (Signed)
 Currently diet controlled, reports fasting glucoses in the 130s. Had been eating less healthy and exercise less during the holidays. Will work on lifestyle modifications.

## 2024-11-19 LAB — HEMOGLOBIN A1C
Est. average glucose Bld gHb Est-mCnc: 146 mg/dL
Hgb A1c MFr Bld: 6.7 % — ABNORMAL HIGH (ref 4.8–5.6)

## 2024-11-19 LAB — COMPREHENSIVE METABOLIC PANEL WITH GFR
ALT: 16 IU/L (ref 0–32)
AST: 18 IU/L (ref 0–40)
Albumin: 4.6 g/dL (ref 3.8–4.9)
Alkaline Phosphatase: 126 IU/L (ref 49–135)
BUN/Creatinine Ratio: 12 (ref 9–23)
BUN: 10 mg/dL (ref 6–24)
Bilirubin Total: 0.4 mg/dL (ref 0.0–1.2)
CO2: 26 mmol/L (ref 20–29)
Calcium: 10.1 mg/dL (ref 8.7–10.2)
Chloride: 99 mmol/L (ref 96–106)
Creatinine, Ser: 0.81 mg/dL (ref 0.57–1.00)
Globulin, Total: 3.2 g/dL (ref 1.5–4.5)
Glucose: 117 mg/dL — ABNORMAL HIGH (ref 70–99)
Potassium: 4.5 mmol/L (ref 3.5–5.2)
Sodium: 139 mmol/L (ref 134–144)
Total Protein: 7.8 g/dL (ref 6.0–8.5)
eGFR: 85 mL/min/1.73

## 2024-11-19 LAB — LIPID PANEL
Chol/HDL Ratio: 4.4 ratio (ref 0.0–4.4)
Cholesterol, Total: 221 mg/dL — ABNORMAL HIGH (ref 100–199)
HDL: 50 mg/dL
LDL Chol Calc (NIH): 153 mg/dL — ABNORMAL HIGH (ref 0–99)
Triglycerides: 101 mg/dL (ref 0–149)
VLDL Cholesterol Cal: 18 mg/dL (ref 5–40)

## 2024-11-21 ENCOUNTER — Ambulatory Visit: Payer: Self-pay | Admitting: Student

## 2024-11-21 DIAGNOSIS — E1169 Type 2 diabetes mellitus with other specified complication: Secondary | ICD-10-CM

## 2024-11-24 DIAGNOSIS — R0609 Other forms of dyspnea: Secondary | ICD-10-CM | POA: Insufficient documentation

## 2024-11-24 DIAGNOSIS — R0789 Other chest pain: Secondary | ICD-10-CM | POA: Insufficient documentation

## 2024-11-24 NOTE — Assessment & Plan Note (Signed)
 ECG with rate of 72, sinus rhythm, short PR interval, no acute ischemic changes, appears c/w the ECG on 12/20/2015. Labs ordered obtain echo.

## 2024-11-24 NOTE — Assessment & Plan Note (Signed)
 Not taking atorvastatin  regularly. Lipid profile.

## 2024-11-29 MED ORDER — ATORVASTATIN CALCIUM 20 MG PO TABS: 20.0000 mg | ORAL_TABLET | Freq: Every day | ORAL | 1 refills | Status: AC

## 2024-12-01 ENCOUNTER — Ambulatory Visit: Admission: RE | Admit: 2024-12-01 | Source: Ambulatory Visit

## 2025-03-21 ENCOUNTER — Ambulatory Visit: Admitting: Student
